# Patient Record
Sex: Male | Born: 1965
Health system: Southern US, Community
[De-identification: ages and names within clinical notes are randomized; demographics above are authoritative.]

## PROBLEM LIST (undated history)

## (undated) DIAGNOSIS — N411 Chronic prostatitis: Secondary | ICD-10-CM

## (undated) DIAGNOSIS — E785 Hyperlipidemia, unspecified: Secondary | ICD-10-CM

## (undated) DIAGNOSIS — I10 Essential (primary) hypertension: Secondary | ICD-10-CM

## (undated) DIAGNOSIS — I639 Cerebral infarction, unspecified: Secondary | ICD-10-CM

## (undated) DIAGNOSIS — E669 Obesity, unspecified: Secondary | ICD-10-CM

## (undated) DIAGNOSIS — E66811 Obesity, class 1: Secondary | ICD-10-CM

## (undated) DIAGNOSIS — K5900 Constipation, unspecified: Secondary | ICD-10-CM

## (undated) HISTORY — DX: Cerebral infarction, unspecified: I63.9

## (undated) HISTORY — DX: Obesity, class 1: E66.811

## (undated) HISTORY — DX: Essential (primary) hypertension: I10

## (undated) HISTORY — PX: HERNIA REPAIR: SHX51

## (undated) HISTORY — DX: Obesity, unspecified: E66.9

## (undated) HISTORY — DX: Hyperlipidemia, unspecified: E78.5

## (undated) HISTORY — DX: Chronic prostatitis: N41.1

## (undated) HISTORY — DX: Constipation, unspecified: K59.00

---

## 2000-04-26 ENCOUNTER — Encounter: Payer: Self-pay | Admitting: Family Medicine

## 2000-04-26 ENCOUNTER — Encounter: Admission: RE | Admit: 2000-04-26 | Discharge: 2000-04-26 | Payer: Self-pay | Admitting: Family Medicine

## 2000-05-24 ENCOUNTER — Encounter: Admission: RE | Admit: 2000-05-24 | Discharge: 2000-05-24 | Payer: Self-pay | Admitting: Orthopaedic Surgery

## 2000-05-24 ENCOUNTER — Encounter: Payer: Self-pay | Admitting: Orthopaedic Surgery

## 2001-10-11 ENCOUNTER — Encounter: Payer: Self-pay | Admitting: Emergency Medicine

## 2001-10-11 ENCOUNTER — Emergency Department (HOSPITAL_COMMUNITY): Admission: EM | Admit: 2001-10-11 | Discharge: 2001-10-12 | Payer: Self-pay | Admitting: Emergency Medicine

## 2001-10-12 ENCOUNTER — Encounter: Payer: Self-pay | Admitting: Emergency Medicine

## 2004-02-23 ENCOUNTER — Emergency Department (HOSPITAL_COMMUNITY): Admission: EM | Admit: 2004-02-23 | Discharge: 2004-02-23 | Payer: Self-pay | Admitting: *Deleted

## 2005-12-11 ENCOUNTER — Ambulatory Visit: Payer: Self-pay | Admitting: Internal Medicine

## 2009-02-05 ENCOUNTER — Encounter: Payer: Self-pay | Admitting: Internal Medicine

## 2010-12-25 ENCOUNTER — Encounter: Payer: Self-pay | Admitting: Internal Medicine

## 2010-12-25 ENCOUNTER — Ambulatory Visit (INDEPENDENT_AMBULATORY_CARE_PROVIDER_SITE_OTHER): Payer: BC Managed Care – PPO | Admitting: Internal Medicine

## 2010-12-25 VITALS — BP 126/88 | HR 90 | Temp 98.2°F | Resp 16 | Ht 70.75 in | Wt 222.0 lb

## 2010-12-25 DIAGNOSIS — Z0000 Encounter for general adult medical examination without abnormal findings: Secondary | ICD-10-CM

## 2010-12-25 LAB — CBC WITH DIFFERENTIAL/PLATELET
Basophils Absolute: 0 10*3/uL (ref 0.0–0.1)
Basophils Relative: 0.6 % (ref 0.0–3.0)
Eosinophils Absolute: 0.4 10*3/uL (ref 0.0–0.7)
Eosinophils Relative: 5.1 % — ABNORMAL HIGH (ref 0.0–5.0)
HCT: 47.3 % (ref 39.0–52.0)
Hemoglobin: 16.4 g/dL (ref 13.0–17.0)
Lymphocytes Relative: 33.3 % (ref 12.0–46.0)
Lymphs Abs: 2.5 10*3/uL (ref 0.7–4.0)
MCHC: 34.6 g/dL (ref 30.0–36.0)
MCV: 88.3 fl (ref 78.0–100.0)
Monocytes Absolute: 0.6 10*3/uL (ref 0.1–1.0)
Monocytes Relative: 7.3 % (ref 3.0–12.0)
Neutro Abs: 4.1 10*3/uL (ref 1.4–7.7)
Neutrophils Relative %: 53.7 % (ref 43.0–77.0)
Platelets: 269 10*3/uL (ref 150.0–400.0)
RBC: 5.36 Mil/uL (ref 4.22–5.81)
RDW: 13.4 % (ref 11.5–14.6)
WBC: 7.6 10*3/uL (ref 4.5–10.5)

## 2010-12-25 LAB — BASIC METABOLIC PANEL
BUN: 10 mg/dL (ref 6–23)
CO2: 25 mEq/L (ref 19–32)
Calcium: 8.9 mg/dL (ref 8.4–10.5)
Chloride: 102 mEq/L (ref 96–112)
Creatinine, Ser: 0.9 mg/dL (ref 0.4–1.5)
GFR: 93.81 mL/min (ref >60.00)
Glucose, Bld: 105 mg/dL — ABNORMAL HIGH (ref 70–99)
Potassium: 3.7 mEq/L (ref 3.5–5.1)
Sodium: 138 mEq/L (ref 135–145)

## 2010-12-25 LAB — HEPATIC FUNCTION PANEL
ALT: 47 U/L (ref 0–53)
AST: 28 U/L (ref 0–37)
Albumin: 4.8 g/dL (ref 3.5–5.2)
Alkaline Phosphatase: 69 U/L (ref 39–117)
Bilirubin, Direct: 0 mg/dL (ref 0.0–0.3)
Total Bilirubin: 0.6 mg/dL (ref 0.3–1.2)
Total Protein: 7.4 g/dL (ref 6.0–8.3)

## 2010-12-25 LAB — LDL CHOLESTEROL, DIRECT: Direct LDL: 171 mg/dL

## 2010-12-25 LAB — LIPID PANEL
Cholesterol: 216 mg/dL — ABNORMAL HIGH (ref 0–200)
HDL: 42.6 mg/dL (ref >39.00)
Total CHOL/HDL Ratio: 5
Triglycerides: 141 mg/dL (ref 0.0–149.0)
VLDL: 28.2 mg/dL (ref 0.0–40.0)

## 2010-12-25 LAB — TSH: TSH: 1.07 u[IU]/mL (ref 0.35–5.50)

## 2010-12-25 NOTE — Progress Notes (Signed)
  Subjective:    Patient ID: Dylan Fuller, male    DOB: [birth year], 46 y.o.   MRN: [id number]  HPI  45 year old patient who is seen today to establish with our practice. He has enjoyed excellent health did have the inguinal hernia. Age 16 otherwise no surgeries. No medical illnesses. No prior hospital admissions. He has been seen by Dr. Earlene Plater for prostatitis in 2011 which has not reoccurred.  Family history father died age 72 complications of valvular heart disease. He had transfusion related hepatitis C. Mother age 24 status post CABG has a history of dyslipidemia and hypertension. 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 One son age 3. Nonsmoker; [employer] graduate      Review of Systems  Constitutional: Negative for fever, chills, activity change, appetite change and fatigue.  HENT: Negative for hearing loss, ear pain, congestion, rhinorrhea, sneezing, mouth sores, trouble swallowing, neck pain, neck stiffness, dental problem, voice change, sinus pressure and tinnitus.   Eyes: Negative for photophobia, pain, redness and visual disturbance.  Respiratory: Negative for apnea, cough, choking, chest tightness, shortness of breath and wheezing.   Cardiovascular: Negative for chest pain, palpitations and leg swelling.  Gastrointestinal: Negative for nausea, vomiting, abdominal pain, diarrhea, constipation, blood in stool, abdominal distention, anal bleeding and rectal pain.  Genitourinary: Negative for dysuria, urgency, frequency, hematuria, flank pain, decreased urine volume, discharge, penile swelling, scrotal swelling, difficulty urinating, genital sores and testicular pain.  Musculoskeletal: Negative for myalgias, back pain, joint swelling, arthralgias and gait problem.  Skin: Negative for color change, rash and wound.  Neurological: Negative for dizziness, tremors, seizures, syncope, facial asymmetry, speech difficulty,  weakness, light-headedness, numbness and headaches.  Hematological: Negative for adenopathy. Does not bruise/bleed easily.  Psychiatric/Behavioral: Negative for suicidal ideas, hallucinations, behavioral problems, confusion, sleep disturbance, self-injury, dysphoric mood, decreased concentration and agitation. The patient is not nervous/anxious.        Objective:   Physical Exam  Constitutional: He is oriented to person, place, and time. He appears well-developed.  HENT:  Head: Normocephalic.  Right Ear: External ear normal.  Left Ear: External ear normal.  Eyes: Conjunctivae and EOM are normal.  Neck: Normal range of motion.  Cardiovascular: Normal rate and normal heart sounds.   Pulmonary/Chest: Breath sounds normal.  Abdominal: Bowel sounds are normal.  Musculoskeletal: Normal range of motion. He exhibits no edema and no tenderness.  Neurological: He is alert and oriented to person, place, and time.  Psychiatric: He has a normal mood and affect. His behavior is normal.          Assessment & Plan:  Mild exogenous obesity Otherwise unremarkable clinical exam  Modest weight loss exercise regimen all encouraged. Fasting laboratory studies will be reviewed

## 2010-12-25 NOTE — Patient Instructions (Signed)
It is important that you exercise regularly, at least 20 minutes 3 to 4 times per week.  If you develop chest pain or shortness of breath seek  medical attention.  You need to lose weight.  Consider a lower calorie diet and regular exercise.  Return office visit in 2 years or as needed

## 2010-12-26 NOTE — Progress Notes (Signed)
Quick Note:  Copy sent to home address ______

## 2011-12-14 ENCOUNTER — Emergency Department (HOSPITAL_COMMUNITY): Payer: BC Managed Care – PPO

## 2011-12-14 ENCOUNTER — Encounter (HOSPITAL_COMMUNITY): Payer: Self-pay | Admitting: *Deleted

## 2011-12-14 ENCOUNTER — Emergency Department (HOSPITAL_COMMUNITY)
Admission: EM | Admit: 2011-12-14 | Discharge: 2011-12-15 | Disposition: A | Discharge status: Home / Self Care | Payer: BC Managed Care – PPO | Attending: Emergency Medicine | Admitting: Emergency Medicine

## 2011-12-14 DIAGNOSIS — S93409A Sprain of unspecified ligament of unspecified ankle, initial encounter: Secondary | ICD-10-CM | POA: Insufficient documentation

## 2011-12-14 DIAGNOSIS — Y9367 Activity, basketball: Secondary | ICD-10-CM | POA: Insufficient documentation

## 2011-12-14 DIAGNOSIS — X500XXA Overexertion from strenuous movement or load, initial encounter: Secondary | ICD-10-CM | POA: Insufficient documentation

## 2011-12-14 MED ORDER — IBUPROFEN 400 MG PO TABS
800.0000 mg | ORAL_TABLET | Freq: Once | ORAL | Status: AC
  Administered 2011-12-14: 800 mg via ORAL
  Filled 2011-12-14: qty 2

## 2011-12-14 NOTE — ED Notes (Signed)
Patient with right ankle injury while playing basketball, patient states he rolled ankle, now with slight edema

## 2011-12-14 NOTE — ED Provider Notes (Signed)
History     CSN: [id number]  Arrival date & time 12/14/11  2132   First MD Initiated Contact with Patient 12/14/11 2303      Chief Complaint  Patient presents with  . Extremity Pain    right ankle    (Consider location/radiation/quality/duration/timing/severity/associated sxs/prior treatment) HPI Comments: Patient reports that just prior to arrival while playing basketball he rolled his right ankle.  He is now having swelling and pain over the lateral malleolus.  He has not taken anything for pain.  He has been applying ice to the ankle, which has helped the swelling somewhat.  He reports that he has significant pain with bearing weight.  Pain also worse with ROM of the right ankle.  He denies any numbness or tingling.    The history is provided by the patient.    Past Medical History  Diagnosis Date  . Prostatitis, chronic     Past Surgical History  Procedure Date  . Hernia repair     age 46 - ?ingunial x2    Family History  Problem Relation Age of Onset  . Arthritis Mother   . Heart disease Mother   . Hyperlipidemia Mother   . Hypertension Mother   . Heart disease Father     History  Substance Use Topics  . Smoking status: Former Smoker    Quit date: 06/15/1986  . Smokeless tobacco: Never Used  . Alcohol Use: Yes      Review of Systems  Musculoskeletal: Positive for joint swelling.  Skin: Negative for color change and wound.  Neurological: Negative for numbness.  All other systems reviewed and are negative.    Allergies  Review of patient's allergies indicates no known allergies.  Home Medications  No current outpatient prescriptions on file.  BP 150/90  Pulse 90  Temp 97.9 F (36.6 C) (Oral)  Resp 19  SpO2 96%  Physical Exam  Nursing note and vitals reviewed. Constitutional: He appears well-developed and well-nourished. No distress.  HENT:  Head: Normocephalic and atraumatic.  Neck: Normal range of motion. Neck supple.  Cardiovascular:  Normal rate, regular rhythm and normal heart sounds.   Pulses:      Dorsalis pedis pulses are 2+ on the right side, and 2+ on the left side.  Pulmonary/Chest: Effort normal and breath sounds normal. No respiratory distress. He has no wheezes. He has no rales.  Musculoskeletal:       Right ankle: He exhibits swelling. He exhibits no ecchymosis. tenderness. Lateral malleolus tenderness found. Achilles tendon normal.       Pain with ROM of the right ankle. Swelling of the lateral malleolus of right ankle.  Neurological: He is alert. No sensory deficit.  Skin: Skin is warm and dry. He is not diaphoretic.  Psychiatric: He has a normal mood and affect.    ED Course  Procedures (including critical care time)  Labs Reviewed - No data to display Dg Ankle Complete Right  12/14/2011  *RADIOLOGY REPORT*  Clinical Data: Right lateral ankle pain, swelling.  Twisting injury.  RIGHT ANKLE - COMPLETE 3+ VIEW  Comparison: None.  Findings: Lateral soft tissue swelling.  Well corticated bone fragment adjacent to the medial malleolus, likely related to old injury.  No acute fracture visualized.  Plantar calcaneal spur present.  IMPRESSION: No acute bony abnormality.  Original Report Authenticated By: Cyndie Chime, M.D.     No diagnosis found.    MDM  Negative xray.  Neurovascularly intact.  Patient given Ankle ASO  and crutches and instructed to follow up with PCP.        Pascal Lux Bradley, PA-C 12/14/11 2336

## 2011-12-14 NOTE — ED Notes (Signed)
Rolled R ankle laterally while playing basketball, planted foot and turned in different direction and rolled ankle, denies h/o ankle issues or surgeries, no meds PTA, no bruising noted, minimal swelling, ice pack applied at present, CMS intact, xrays resulted and reviewed as negative.

## 2011-12-14 NOTE — Discharge Instructions (Signed)
Ankle Sprain  An ankle sprain is an injury to the ligaments that hold the ankle joint together. Your X-ray today showed no evidence of fracture, however keep all follow-up appointments with an orthopedic specialist to have follow-up X-rays, because as we discussed fractures may not appear until 3 days after the acute injury.    TREATMENT  Rest, ice, elevation, and compression are the basic modes of treatment.    HOME CARE INSTRUCTIONS  Apply ice to the sore area for 15 to 20 minutes, 3 to 4 times per day. Do this while you are awake for the first 2 days, or as directed. This can be stopped when the swelling goes away. Put the ice in a plastic bag and place a towel between the bag of ice and your skin.  Keep your leg elevated when possible to lessen swelling.  If your caregiver recommends crutches, use them as instructed for 1 week. Then, you may walk on your ankle weight bearing as tolerated.  You may take off your ankle stabilizer at night and to take a shower or bath. Wiggle your toes in the splint several times per day if you are able.  Do not drive a vehicle on pain medication. ACTIVITY:            - Weight bearing as tolerated            - Exercises should be limited to pain free range of motion            - Can start mobilization by tracing the alphabet with your foot in the air.       SEEK MEDICAL CARE IF:  You have an increase in bruising, swelling, or pain.  Your toes feel cold.  Pain relief is not achieved with medications.  EMERGENCY:: Your toes are numb or blue or you have severe pain.  MAKE SURE YOU:  Understand these instructions.  Will watch your condition.  Will get help right away if you are not doing well or get worse   COLD THERAPY DIRECTIONS:  Ice or gel packs can be used to reduce both pain and swelling. Ice is the most helpful within the first 24 to 48 hours after an injury or flareup from overusing a muscle or joint.  Ice is effective, has very few side effects,  and is safe for most people to use.   If you expose your skin to cold temperatures for too long or without the proper protection, you can damage your skin or nerves. Watch for signs of skin damage due to cold.   HOME CARE INSTRUCTIONS  Follow these tips to use ice and cold packs safely.  Place a dry or damp towel between the ice and skin. A damp towel will cool the skin more quickly, so you may need to shorten the time that the ice is used.  For a more rapid response, add gentle compression to the ice.  Ice for no more than 10 to 20 minutes at a time. The bonier the area you are icing, the less time it will take to get the benefits of ice.  Check your skin after 5 minutes to make sure there are no signs of a poor response to cold or skin damage.  Rest 20 minutes or more in between uses.  Once your skin is numb, you can end your treatment. You can test numbness by very lightly touching your skin. The touch should be so light that you do not see  the skin dimple from the pressure of your fingertip. When using ice, most people will feel these normal sensations in this order: cold, burning, aching, and numbness.  Do not use ice on someone who cannot communicate their responses to pain, such as small children or people with dementia.   HOW TO MAKE AN ICE PACK  To make an ice pack, do one of the following:  Place crushed ice or a bag of frozen vegetables in a sealable plastic bag. Squeeze out the excess air. Place this bag inside another plastic bag. Slide the bag into a pillowcase or place a damp towel between your skin and the bag.  Mix 3 parts water with 1 part rubbing alcohol. Freeze the mixture in a sealable plastic bag. When you remove the mixture from the freezer, it will be slushy. Squeeze out the excess air. Place this bag inside another plastic bag. Slide the bag into a pillowcase or place a damp towel between your sk

## 2011-12-15 NOTE — ED Provider Notes (Signed)
Medical screening examination/treatment/procedure(s) were performed by non-physician practitioner and as supervising physician I was immediately available for consultation/collaboration.   Dylan Fuller. Oletta Lamas, MD 12/15/11 4098

## 2013-03-06 ENCOUNTER — Encounter: Payer: Self-pay | Admitting: Internal Medicine

## 2013-03-06 ENCOUNTER — Ambulatory Visit (INDEPENDENT_AMBULATORY_CARE_PROVIDER_SITE_OTHER): Payer: BC Managed Care – PPO | Admitting: Internal Medicine

## 2013-03-06 VITALS — BP 140/90 | HR 109 | Temp 98.3°F | Resp 20 | Wt 233.0 lb

## 2013-03-06 DIAGNOSIS — R1032 Left lower quadrant pain: Secondary | ICD-10-CM

## 2013-03-06 NOTE — Patient Instructions (Signed)
Call or return to clinic prn if these symptoms worsen or fail to improve as anticipated.

## 2013-03-06 NOTE — Progress Notes (Signed)
  Subjective:    Patient ID: Dylan Fuller, male    DOB: 1966-04-06, 47 y.o.   MRN: [id number]  HPI  47 year old patient who presents with a 24-hour history of left lower) abdominal pain.  The day prior he was doing some heavy yard work and is also noticed some mild low back discomfort. He denies any fever nausea change in bowel habits or other constitutional complaints. Pain is aggravated by movement  Past Medical History  Diagnosis Date  . Prostatitis, chronic     History   Social History  . Marital Status: Married    Spouse Name: N/A    Number of Children: N/A  . Years of Education: N/A   Occupational History  . Not on file.   Social History Main Topics  . Smoking status: Former Smoker    Quit date: 06/15/1986  . Smokeless tobacco: Never Used  . Alcohol Use: Yes  . Drug Use: No  . Sexual Activity: Not on file   Other Topics Concern  . Not on file   Social History Narrative  . No narrative on file    Past Surgical History  Procedure Laterality Date  . Hernia repair      age 47 - ?ingunial x2    Family History  Problem Relation Age of Onset  . Arthritis Mother   . Heart disease Mother   . Hyperlipidemia Mother   . Hypertension Mother   . Heart disease Father     No Known Allergies  No current outpatient prescriptions on file prior to visit.   No current facility-administered medications on file prior to visit.    BP 140/90  Pulse 109  Temp(Src) 98.3 F (36.8 C) (Oral)  Resp 20  Wt 233 lb (105.688 kg)  BMI 32.73 kg/m2  SpO2 97%       Review of Systems  Constitutional: Negative for fever, chills, appetite change and fatigue.  HENT: Negative for hearing loss, ear pain, congestion, sore throat, trouble swallowing, neck stiffness, dental problem, voice change and tinnitus.   Eyes: Negative for pain, discharge and visual disturbance.  Respiratory: Negative for cough, chest tightness, wheezing and stridor.   Cardiovascular: Negative for chest  pain, palpitations and leg swelling.  Gastrointestinal: Positive for abdominal pain. Negative for nausea, vomiting, diarrhea, constipation, blood in stool and abdominal distention.  Genitourinary: Negative for urgency, hematuria, flank pain, discharge, difficulty urinating and genital sores.  Musculoskeletal: Negative for myalgias, back pain, joint swelling, arthralgias and gait problem.  Skin: Negative for rash.  Neurological: Negative for dizziness, syncope, speech difficulty, weakness, numbness and headaches.  Hematological: Negative for adenopathy. Does not bruise/bleed easily.  Psychiatric/Behavioral: Negative for behavioral problems and dysphoric mood. The patient is not nervous/anxious.        Objective:   Physical Exam  Constitutional: He appears well-developed and well-nourished. No distress.  Abdominal: Soft. Bowel sounds are normal. He exhibits no distension and no mass. There is tenderness. There is no rebound and no guarding.  mild tenderness in the left lower quadrant without rebound          Assessment & Plan:   Left lower quadrant abdominal pain. Suspect this is abdominal wall origin. Doubt early/mild acute diverticulitis. Will observe at this time. The patient will notify the office if there is any fever or clinical worsening

## 2013-07-28 ENCOUNTER — Encounter: Payer: Self-pay | Admitting: Internal Medicine

## 2013-07-28 ENCOUNTER — Ambulatory Visit (INDEPENDENT_AMBULATORY_CARE_PROVIDER_SITE_OTHER): Payer: BC Managed Care – PPO | Admitting: Internal Medicine

## 2013-07-28 VITALS — BP 130/90 | HR 114 | Temp 99.3°F | Resp 20 | Ht 70.75 in | Wt 225.0 lb

## 2013-07-28 DIAGNOSIS — J111 Influenza due to unidentified influenza virus with other respiratory manifestations: Secondary | ICD-10-CM

## 2013-07-28 MED ORDER — HYDROCODONE-HOMATROPINE 5-1.5 MG/5ML PO SYRP: 5.0000 mL | ORAL_SOLUTION | Freq: Four times a day (QID) | ORAL | Status: DC | PRN

## 2013-07-28 MED ORDER — OSELTAMIVIR PHOSPHATE 75 MG PO CAPS: 75.0000 mg | ORAL_CAPSULE | Freq: Two times a day (BID) | ORAL | Status: DC

## 2013-07-28 NOTE — Patient Instructions (Addendum)
Take over-the-counter expectorants and cough medications such as  Mucinex DM.  Call if there is no improvement in 5 to 7 days or if he developed worsening cough, fever, or new symptoms, such as shortness of breath or chest pain.  Take [phone] mg of Tylenol every 6 hours as needed for pain relief or fever.  Avoid taking more than 3000 mg in a 24-hour period (  This may cause liver damage).  Influenza, Adult Influenza ("the flu") is a viral infection of the respiratory tract. It occurs more often in winter months because people spend more time in close contact with one another. Influenza can make you feel very sick. Influenza easily spreads from person to person (contagious). CAUSES  Influenza is caused by a virus that infects the respiratory tract. You can catch the virus by breathing in droplets from an infected person's cough or sneeze. You can also catch the virus by touching something that was recently contaminated with the virus and then touching your mouth, nose, or eyes. SYMPTOMS  Symptoms typically last 4 to 10 days and may include:  Fever.  Chills.  Headache, body aches, and muscle aches.  Sore throat.  Chest discomfort and cough.  Poor appetite.  Weakness or feeling tired.  Dizziness.  Nausea or vomiting. DIAGNOSIS  Diagnosis of influenza is often made based on your history and a physical exam. A nose or throat swab test can be done to confirm the diagnosis. RISKS AND COMPLICATIONS You may be at risk for a more severe case of influenza if you smoke cigarettes, have diabetes, have chronic heart disease (such as heart failure) or lung disease (such as asthma), or if you have a weakened immune system. Elderly people and pregnant women are also at risk for more serious infections. The most common complication of influenza is a lung infection (pneumonia). Sometimes, this complication can require emergency medical care and may be life-threatening. PREVENTION  An annual influenza  vaccination (flu shot) is the best way to avoid getting influenza. An annual flu shot is now routinely recommended for all adults in the [place] TREATMENT  In mild cases, influenza goes away on its own. Treatment is directed at relieving symptoms. For more severe cases, your caregiver may prescribe antiviral medicines to shorten the sickness. Antibiotic medicines are not effective, because the infection is caused by a virus, not by bacteria. HOME CARE INSTRUCTIONS  Only take over-the-counter or prescription medicines for pain, discomfort, or fever as directed by your caregiver.  Use a cool mist humidifier to make breathing easier.  Get plenty of rest until your temperature returns to normal. This usually takes 3 to 4 days.  Drink enough fluids to keep your urine clear or pale yellow.  Cover your mouth and nose when coughing or sneezing, and wash your hands well to avoid spreading the virus.  Stay home from work or school until your fever has been gone for at least 1 full day. SEEK MEDICAL CARE IF:   You have chest pain or a deep cough that worsens or produces more mucus.  You have nausea, vomiting, or diarrhea. SEEK IMMEDIATE MEDICAL CARE IF:   You have difficulty breathing, shortness of breath, or your skin or nails turn bluish.  You have severe neck pain or stiffness.  You have a severe headache, facial pain, or earache.  You have a worsening or recurring fever.  You have nausea or vomiting that cannot be controlled. MAKE SURE YOU:  Understand these instructions.  Will watch your  condition.  Will get help right away if you are not doing well or get worse. Document Released: 05/29/2000 Document Revised: 12/01/2011 Document Reviewed: 08/31/2011 [facility] Patient Information 2014 [place], [place].

## 2013-07-28 NOTE — Progress Notes (Signed)
Subjective:    Patient ID: Dylan Fuller, male    DOB: [birth year], 48 y.o.   MRN: [id number]  HPI 48 year old patient, who presents with a two-day history of cough, congestion, fever, chills, general body aches.  Cough is largely nonproductive.  No chest pain, wheezing, or shortness of breath.  No prior history of pneumonia. No pertinent exposure or travel history.  Past Medical History  Diagnosis Date  . Prostatitis, chronic     History   Social History  . Marital Status: Married    Spouse Name: N/A    Number of Children: N/A  . Years of Education: N/A   Occupational History  . Not on file.   Social History Main Topics  . Smoking status: Former Smoker    Quit date: 06/15/1986  . Smokeless tobacco: Never Used  . Alcohol Use: Yes  . Drug Use: No  . Sexual Activity: Not on file   Other Topics Concern  . Not on file   Social History Narrative  . No narrative on file    Past Surgical History  Procedure Laterality Date  . Hernia repair      age 48 - ?ingunial x2    Family History  Problem Relation Age of Onset  . Arthritis Mother   . Heart disease Mother   . Hyperlipidemia Mother   . Hypertension Mother   . Heart disease Father     No Known Allergies  Current Outpatient Prescriptions on File Prior to Visit  Medication Sig Dispense Refill  . naproxen sodium (ALEVE) 220 MG tablet Take 220 mg by mouth as needed.       No current facility-administered medications on file prior to visit.    BP 130/90  Pulse 114  Temp(Src) 99.3 F (37.4 C) (Oral)  Resp 20  Ht 5' 10.75" (1.797 m)  Wt 225 lb (102.059 kg)  BMI 31.60 kg/m2  SpO2 98%        Review of Systems  Constitutional: Positive for fever, chills, activity change, appetite change and fatigue.  HENT: Negative for congestion, dental problem, ear pain, hearing loss, sore throat, tinnitus, trouble swallowing and voice change.   Eyes: Negative for pain, discharge and visual disturbance.    Respiratory: Positive for cough. Negative for chest tightness, wheezing and stridor.   Cardiovascular: Negative for chest pain, palpitations and leg swelling.  Gastrointestinal: Positive for nausea. Negative for vomiting, abdominal pain, diarrhea, constipation, blood in stool and abdominal distention.  Genitourinary: Negative for urgency, hematuria, flank pain, discharge, difficulty urinating and genital sores.  Musculoskeletal: Negative for arthralgias, back pain, gait problem, joint swelling, myalgias and neck stiffness.  Skin: Negative for rash.  Neurological: Negative for dizziness, syncope, speech difficulty, weakness, numbness and headaches.  Hematological: Negative for adenopathy. Does not bruise/bleed easily.  Psychiatric/Behavioral: Negative for behavioral problems and dysphoric mood. The patient is not nervous/anxious.        Objective:   Physical Exam  Constitutional: He is oriented to person, place, and time. He appears well-developed.  HENT:  Head: Normocephalic.  Right Ear: External ear normal.  Left Ear: External ear normal.  Eyes: Conjunctivae and EOM are normal.  Neck: Normal range of motion.  Cardiovascular: Normal rate and normal heart sounds.   Pulmonary/Chest: Effort normal and breath sounds normal. No respiratory distress. He has no wheezes. He has no rales. He exhibits no tenderness.  Careful auscultation of the chest revealed all lung fields to be clear  Abdominal: Bowel sounds are normal.  Musculoskeletal: Normal range of motion. He exhibits no edema and no tenderness.  Neurological: He is alert and oriented to person, place, and time.  Psychiatric: He has a normal mood and affect. His behavior is normal.          Assessment & Plan:  Flu syndrome.  We'll treat with Tamiflu for 5 days, as well as symptom control We'll call if unimproved

## 2013-07-28 NOTE — Progress Notes (Signed)
Pre-visit discussion using our clinic review tool. No additional management support is needed unless otherwise documented below in the visit note.  

## 2015-07-25 ENCOUNTER — Other Ambulatory Visit: Payer: Self-pay | Admitting: Physician Assistant

## 2015-07-25 ENCOUNTER — Ambulatory Visit
Admission: RE | Admit: 2015-07-25 | Discharge: 2015-07-25 | Disposition: A | Discharge status: Home / Self Care | Payer: BLUE CROSS/BLUE SHIELD | Source: Ambulatory Visit | Attending: Physician Assistant | Admitting: Physician Assistant

## 2015-07-25 DIAGNOSIS — R600 Localized edema: Secondary | ICD-10-CM

## 2015-10-23 ENCOUNTER — Ambulatory Visit (INDEPENDENT_AMBULATORY_CARE_PROVIDER_SITE_OTHER): Payer: BLUE CROSS/BLUE SHIELD | Admitting: Internal Medicine

## 2015-10-23 ENCOUNTER — Encounter: Payer: Self-pay | Admitting: Internal Medicine

## 2015-10-23 VITALS — BP 140/90 | HR 109 | Temp 98.0°F | Resp 20 | Ht 70.75 in | Wt 240.0 lb

## 2015-10-23 DIAGNOSIS — R300 Dysuria: Secondary | ICD-10-CM | POA: Diagnosis not present

## 2015-10-23 DIAGNOSIS — R1030 Lower abdominal pain, unspecified: Secondary | ICD-10-CM

## 2015-10-23 LAB — POC URINALSYSI DIPSTICK (AUTOMATED)
Glucose, UA: NEGATIVE
LEUKOCYTES UA: NEGATIVE
Nitrite, UA: NEGATIVE
PH UA: 7
RBC UA: NEGATIVE
Spec Grav, UA: 1.025
Urobilinogen, UA: 1

## 2015-10-23 NOTE — Patient Instructions (Signed)
Call or return to clinic prn if these symptoms worsen or fail to improve as anticipated.

## 2015-10-23 NOTE — Progress Notes (Signed)
Pre visit review using our clinic review tool, if applicable. No additional management support is needed unless otherwise documented below in the visit note. 

## 2015-10-23 NOTE — Progress Notes (Signed)
   Subjective:    Patient ID: Dylan Fuller, male    DOB: 03-Jan-1966, 50 y.o.   MRN: [id number]  HPI    50 year old patient who presents with a one-day history of intermittent low back pain, groin pain, and mild dysuria.  He states that he had similar symptoms about 5 years ago, but much more severe when he was treated for acute prostatitis  Past Medical History  Diagnosis Date  . Prostatitis, chronic      Social History   Social History  . Marital Status: Married    Spouse Name: N/A  . Number of Children: N/A  . Years of Education: N/A   Occupational History  . Not on file.   Social History Main Topics  . Smoking status: Former Smoker    Quit date: 06/15/1986  . Smokeless tobacco: Never Used  . Alcohol Use: Yes  . Drug Use: No  . Sexual Activity: Not on file   Other Topics Concern  . Not on file   Social History Narrative    Past Surgical History  Procedure Laterality Date  . Hernia repair      age 21 - ?ingunial x2    Family History  Problem Relation Age of Onset  . Arthritis Mother   . Heart disease Mother   . Hyperlipidemia Mother   . Hypertension Mother   . Heart disease Father     No Known Allergies  Current Outpatient Prescriptions on File Prior to Visit  Medication Sig Dispense Refill  . naproxen sodium (ALEVE) 220 MG tablet Take 220 mg by mouth as needed.     No current facility-administered medications on file prior to visit.    BP 140/90 mmHg  Pulse 109  Temp(Src) 98 F (36.7 C) (Oral)  Resp 20  Ht 5' 10.75" (1.797 m)  Wt 240 lb (108.863 kg)  BMI 33.71 kg/m2  SpO2 98%     Review of Systems  Constitutional: Negative for fever, chills, appetite change and fatigue.  HENT: Negative for congestion, dental problem, ear pain, hearing loss, sore throat, tinnitus, trouble swallowing and voice change.   Eyes: Negative for pain, discharge and visual disturbance.  Respiratory: Negative for cough, chest tightness, wheezing and stridor.     Cardiovascular: Negative for chest pain, palpitations and leg swelling.  Gastrointestinal: Negative for nausea, vomiting, abdominal pain, diarrhea, constipation, blood in stool and abdominal distention.  Genitourinary: Positive for dysuria. Negative for urgency, hematuria, flank pain, discharge, difficulty urinating and genital sores.  Musculoskeletal: Positive for back pain. Negative for myalgias, joint swelling, arthralgias, gait problem and neck stiffness.  Skin: Negative for rash.  Neurological: Negative for dizziness, syncope, speech difficulty, weakness, numbness and headaches.  Hematological: Negative for adenopathy. Does not bruise/bleed easily.  Psychiatric/Behavioral: Negative for behavioral problems and dysphoric mood. The patient is not nervous/anxious.        Objective:   Physical Exam  Constitutional: He appears well-developed and well-nourished. No distress.  Abdominal: Soft. Bowel sounds are normal.  Genitourinary: Prostate normal.          Assessment & Plan:   , mild dysuria with groin and back pain , history of prostatitis  normal.  Clinical exam  normal.  UA   will continue ibuprofen and observe at this time .  He'll report any new or worsening symptoms

## 2016-09-18 ENCOUNTER — Encounter: Payer: Self-pay | Admitting: Family Medicine

## 2016-09-18 ENCOUNTER — Ambulatory Visit (INDEPENDENT_AMBULATORY_CARE_PROVIDER_SITE_OTHER): Payer: BLUE CROSS/BLUE SHIELD | Admitting: Family Medicine

## 2016-09-18 VITALS — BP 130/90 | HR 64 | Temp 98.0°F | Wt 229.9 lb

## 2016-09-18 DIAGNOSIS — Z1211 Encounter for screening for malignant neoplasm of colon: Secondary | ICD-10-CM | POA: Diagnosis not present

## 2016-09-18 DIAGNOSIS — K5900 Constipation, unspecified: Secondary | ICD-10-CM

## 2016-09-18 LAB — CBC WITH DIFFERENTIAL/PLATELET
Basophils Absolute: 0 cells/uL (ref 0–200)
Basophils Relative: 0 %
EOS PCT: 3 %
Eosinophils Absolute: 273 cells/uL (ref 15–500)
HCT: 44.9 % (ref 38.5–50.0)
HEMOGLOBIN: 15.3 g/dL (ref 13.2–17.1)
LYMPHS ABS: 3003 {cells}/uL (ref 850–3900)
Lymphocytes Relative: 33 %
MCH: 29.5 pg (ref 27.0–33.0)
MCHC: 34.1 g/dL (ref 32.0–36.0)
MCV: 86.5 fL (ref 80.0–100.0)
MPV: 8.8 fL (ref 7.5–12.5)
Monocytes Absolute: 546 cells/uL (ref 200–950)
Monocytes Relative: 6 %
Neutro Abs: 5278 cells/uL (ref 1500–7800)
Neutrophils Relative %: 58 %
Platelets: 268 10*3/uL (ref 140–400)
RBC: 5.19 MIL/uL (ref 4.20–5.80)
RDW: 13.5 % (ref 11.0–15.0)
WBC: 9.1 10*3/uL (ref 3.8–10.8)

## 2016-09-18 LAB — TSH: TSH: 1.03 mIU/L (ref 0.40–4.50)

## 2016-09-18 NOTE — Progress Notes (Signed)
Subjective:     Patient ID: Dylan Fuller, male   DOB: Nov 08, 1965, 51 y.o.   MRN: [id number]  HPI Patient is seen with constipation as chief complaint. He states last May had similar episode of constipation. Most recent episode started around March 21. He went almost 10 days without much of a significant bowel movement. He had some bloating and right lower quadrant discomfort. Took some milk of magnesia, MiraLAX, and Fleet enema and eventually had good bowel movement couple days ago. He feels much better today. He has not noted any bloody stools. Denies any recent appetite or weight changes. Currently denies abdominal pain.  Denies any recent travels. Takes no regular medications. Never had screening colonoscopy. Been trying to drink more water. He realizes he probably doesn't get enough fiber  Past Medical History:  Diagnosis Date  . Prostatitis, chronic    Past Surgical History:  Procedure Laterality Date  . HERNIA REPAIR     age 56 - ?ingunial x2    reports that he quit smoking about 30 years ago. He has never used smokeless tobacco. He reports that he drinks alcohol. He reports that he does not use drugs. family history includes Arthritis in his mother; Heart disease in his father and mother; Hyperlipidemia in his mother; Hypertension in his mother. No Known Allergies   Review of Systems  Constitutional: Negative for appetite change, fever and unexpected weight change.  Respiratory: Negative for shortness of breath.   Cardiovascular: Negative for chest pain.  Gastrointestinal: Positive for constipation. Negative for abdominal distention, abdominal pain, blood in stool, diarrhea, nausea and vomiting.  Genitourinary: Negative for dysuria.       Objective:   Physical Exam  Constitutional: He appears well-developed and well-nourished.  Cardiovascular: Normal rate and regular rhythm.   Pulmonary/Chest: Effort normal and breath sounds normal. No respiratory distress. He has no  wheezes. He has no rales.  Abdominal: Soft. Bowel sounds are normal. He exhibits no distension and no mass. There is no tenderness. There is no rebound and no guarding.  Genitourinary: Rectum normal.  Genitourinary Comments: No impaction. No rectal mass palpated.       Assessment:     Constipation which is relatively new symptom.  Improved after recent over-the-counter medications with MiraLAX and enema    Plan:     -Increase water consumption -Recommend 25-30 g of fiber per day -Regular exercise -Check labs with CBC and TSH -Set up screening colonoscopy -continue with Miralax as needed.  Eulas Post MD [facility] at [facility]

## 2016-09-18 NOTE — Patient Instructions (Signed)

## 2016-09-18 NOTE — Progress Notes (Signed)
Pre visit review using our clinic review tool, if applicable. No additional management support is needed unless otherwise documented below in the visit note. 

## 2016-09-24 ENCOUNTER — Encounter: Payer: Self-pay | Admitting: Gastroenterology

## 2016-11-16 ENCOUNTER — Ambulatory Visit (AMBULATORY_SURGERY_CENTER): Payer: Self-pay | Admitting: *Deleted

## 2016-11-16 VITALS — Ht 72.0 in | Wt 231.6 lb

## 2016-11-16 DIAGNOSIS — Z1211 Encounter for screening for malignant neoplasm of colon: Secondary | ICD-10-CM

## 2016-11-16 MED ORDER — NA SULFATE-K SULFATE-MG SULF 17.5-3.13-1.6 GM/177ML PO SOLN: 1.0000 | Freq: Once | ORAL | 0 refills | Status: AC

## 2016-11-16 NOTE — Progress Notes (Signed)
No egg or soy allergy known to patient  No issues with past sedation with any surgeries  or procedures, no intubation problems  No diet pills per patient No home 02 use per patient  No blood thinners per patient  Pt denies issues with constipation  No A fib or A flutter   

## 2016-11-27 ENCOUNTER — Encounter: Payer: Self-pay | Admitting: Gastroenterology

## 2016-11-30 ENCOUNTER — Encounter: Payer: BLUE CROSS/BLUE SHIELD | Admitting: Gastroenterology

## 2016-12-11 ENCOUNTER — Ambulatory Visit (AMBULATORY_SURGERY_CENTER): Payer: BLUE CROSS/BLUE SHIELD | Admitting: Gastroenterology

## 2016-12-11 ENCOUNTER — Encounter: Payer: Self-pay | Admitting: Gastroenterology

## 2016-12-11 VITALS — BP 118/88 | HR 73 | Temp 99.1°F | Resp 15 | Ht 72.0 in | Wt 231.0 lb

## 2016-12-11 DIAGNOSIS — D126 Benign neoplasm of colon, unspecified: Secondary | ICD-10-CM

## 2016-12-11 DIAGNOSIS — Z1212 Encounter for screening for malignant neoplasm of rectum: Secondary | ICD-10-CM | POA: Diagnosis not present

## 2016-12-11 DIAGNOSIS — Z1211 Encounter for screening for malignant neoplasm of colon: Secondary | ICD-10-CM

## 2016-12-11 DIAGNOSIS — K635 Polyp of colon: Secondary | ICD-10-CM

## 2016-12-11 DIAGNOSIS — D127 Benign neoplasm of rectosigmoid junction: Secondary | ICD-10-CM

## 2016-12-11 DIAGNOSIS — D123 Benign neoplasm of transverse colon: Secondary | ICD-10-CM

## 2016-12-11 DIAGNOSIS — K6389 Other specified diseases of intestine: Secondary | ICD-10-CM | POA: Diagnosis not present

## 2016-12-11 DIAGNOSIS — D124 Benign neoplasm of descending colon: Secondary | ICD-10-CM | POA: Diagnosis not present

## 2016-12-11 DIAGNOSIS — D125 Benign neoplasm of sigmoid colon: Secondary | ICD-10-CM

## 2016-12-11 MED ORDER — SODIUM CHLORIDE 0.9 % IV SOLN: 500.0000 mL | INTRAVENOUS | Status: DC

## 2016-12-11 NOTE — Progress Notes (Signed)
Called to room to assist during endoscopic procedure.  Patient ID and intended procedure confirmed with present staff. Received instructions for my participation in the procedure from the performing physician.  

## 2016-12-11 NOTE — Progress Notes (Signed)
Pt's states no medical or surgical changes since previsit or office visit. 

## 2016-12-11 NOTE — Progress Notes (Signed)
Alert and oriented x3, pleased with MAC, report to RN  

## 2016-12-11 NOTE — Patient Instructions (Signed)
YOU HAD AN ENDOSCOPIC PROCEDURE TODAY AT [facility]:   Refer to the procedure report that was given to you for any specific questions about what was found during the examination.  If the procedure report does not answer your questions, please call your gastroenterologist to clarify.  If you requested that your care partner not be given the details of your procedure findings, then the procedure report has been included in a sealed envelope for you to review at your convenience later.  YOU SHOULD EXPECT: Some feelings of bloating in the abdomen. Passage of more gas than usual.  Walking can help get rid of the air that was put into your GI tract during the procedure and reduce the bloating. If you had a lower endoscopy (such as a colonoscopy or flexible sigmoidoscopy) you may notice spotting of blood in your stool or on the toilet paper. If you underwent a bowel prep for your procedure, you may not have a normal bowel movement for a few days.  Please Note:  You might notice some irritation and congestion in your nose or some drainage.  This is from the oxygen used during your procedure.  There is no need for concern and it should clear up in a day or so.  SYMPTOMS TO REPORT IMMEDIATELY:   Following lower endoscopy (colonoscopy or flexible sigmoidoscopy):  Excessive amounts of blood in the stool  Significant tenderness or worsening of abdominal pains  Swelling of the abdomen that is new, acute  Fever of 100F or higher   For urgent or emergent issues, a gastroenterologist can be reached at any hour by calling [phone].   DIET:  We do recommend a small meal at first, but then you may proceed to your regular diet.  Drink plenty of fluids but you should avoid alcoholic beverages for 24 hours.  ACTIVITY:  You should plan to take it easy for the rest of today and you should NOT DRIVE or use heavy machinery until tomorrow (because of the sedation medicines used during the test).     FOLLOW UP: Our staff will call the number listed on your records the next business day following your procedure to check on you and address any questions or concerns that you may have regarding the information given to you following your procedure. If we do not reach you, we will leave a message.  However, if you are feeling well and you are not experiencing any problems, there is no need to return our call.  We will assume that you have returned to your regular daily activities without incident.  If any biopsies were taken you will be contacted by phone or by letter within the next 1-3 weeks.  Please call us at [phone] if you have not heard about the biopsies in 3 weeks.   Await for biopsy results to determine next repeat Colonoscopy screening Polyps (handout given) 'Diverticulosis (handout given) No Ibuprofen, naproxen, or other non-steriodal anti-inflammatory drugs for 2 weeks after polyp removal  SIGNATURES/CONFIDENTIALITY: You and/or your care partner have signed paperwork which will be entered into your electronic medical record.  These signatures attest to the fact that that the information above on your After Visit Summary has been reviewed and is understood.  Full responsibility of the confidentiality of this discharge information lies with you and/or your care-partner.

## 2016-12-11 NOTE — Op Note (Addendum)
[place] Patient Name: Dylan Fuller Procedure Date: 12/11/2016 11:19 AM MRN: [id number] Endoscopist: Remo Lipps P. Kioni Stahl MD, MD Age: 51 Referring MD:  Date of Birth: [birth year] Gender: Male Account #: [id number] Procedure:                Colonoscopy Indications:              Screening for colorectal malignant neoplasm, This                            is the patient's first colonoscopy Medicines:                Monitored Anesthesia Care Procedure:                Pre-Anesthesia Assessment:                           - Prior to the procedure, a History and Physical                            was performed, and patient medications and                            allergies were reviewed. The patient's tolerance of                            previous anesthesia was also reviewed. The risks                            and benefits of the procedure and the sedation                            options and risks were discussed with the patient.                            All questions were answered, and informed consent                            was obtained. Prior Anticoagulants: The patient has                            taken no previous anticoagulant or antiplatelet                            agents. ASA Grade Assessment: II - A patient with                            mild systemic disease. After reviewing the risks                            and benefits, the patient was deemed in                            satisfactory condition to undergo the procedure.  After obtaining informed consent, the colonoscope                            was passed under direct vision. Throughout the                            procedure, the patient's blood pressure, pulse, and                            oxygen saturations were monitored continuously. The                            Colonoscope was introduced through the anus and                            advanced to the the  cecum, identified by                            appendiceal orifice and ileocecal valve. The                            colonoscopy was performed without difficulty. The                            patient tolerated the procedure well. The quality                            of the bowel preparation was good. The ileocecal                            valve, appendiceal orifice, and rectum were                            photographed. Scope In: 11:27:27 AM Scope Out: 11:47:15 AM Scope Withdrawal Time: 0 hours 16 minutes 19 seconds  Total Procedure Duration: 0 hours 19 minutes 48 seconds  Findings:                 The perianal and digital rectal examinations were                            normal.                           Multiple medium-mouthed diverticula were found in                            the transverse colon and left colon.                           Two sessile polyps were found in the transverse                            colon. The polyps were 5 mm in size. These polyps  were removed with a cold snare. Resection and                            retrieval were complete.                           A 5 mm polyp was found in the descending colon. The                            polyp was sessile. The polyp was removed with a                            cold snare. Resection and retrieval were complete.                           A 4 mm polyp was found in the sigmoid colon arising                            out of a diverticulum, it appeared to most likely                            be an inflammatory polyp. The polyp was sessile.                            The polyp was removed with a cold snare.                           A 5 mm polyp was found in the recto-sigmoid colon.                            The polyp was sessile. The polyp was removed with a                            cold snare. Resection and retrieval were complete.                           The exam was  otherwise without abnormality on                            direct and retroflexion views. Complications:            No immediate complications. Estimated blood loss:                            Minimal. Estimated Blood Loss:     Estimated blood loss was minimal. Impression:               - Diverticulosis in the transverse colon and in the                            left colon.                           - Two 5  mm polyps in the transverse colon, removed                            with a cold snare. Resected and retrieved.                           - One 5 mm polyp in the descending colon, removed                            with a cold snare. Resected and retrieved.                           - One 4 mm polyp in the sigmoid colon, removed with                            a cold snare. Resected and retrieved.                           - One 5 mm polyp at the recto-sigmoid colon,                            removed with a cold snare. Resected and retrieved.                           - The examination was otherwise normal on direct                            and retroflexion views. Recommendation:           - Patient has a contact number available for                            emergencies. The signs and symptoms of potential                            delayed complications were discussed with the                            patient. Return to normal activities tomorrow.                            Written discharge instructions were provided to the                            patient.                           - Resume previous diet.                           - Continue present medications.                           - Await pathology results.                           -  Repeat colonoscopy is recommended for                            surveillance. The colonoscopy date will be                            determined after pathology results from today's                            exam become available  for review.                           - No ibuprofen, naproxen, or other non-steroidal                            anti-inflammatory drugs for 2 weeks after polyp                            removal. Remo Lipps P. Ryanna Teschner MD, MD 12/11/2016 11:53:08 AM This report has been signed electronically.

## 2016-12-14 ENCOUNTER — Telehealth: Payer: Self-pay

## 2016-12-14 ENCOUNTER — Telehealth: Payer: Self-pay | Admitting: Gastroenterology

## 2016-12-14 NOTE — Telephone Encounter (Signed)
Left message on answering machine. 

## 2016-12-14 NOTE — Telephone Encounter (Signed)
  Follow up Call-  Call back number 12/11/2016  Post procedure Call Back phone  # [phone]  Permission to leave phone message Yes  Some recent data might be hidden     Left message

## 2016-12-18 ENCOUNTER — Encounter: Payer: Self-pay | Admitting: Gastroenterology

## 2017-01-13 ENCOUNTER — Encounter: Payer: Self-pay | Admitting: Internal Medicine

## 2017-01-13 ENCOUNTER — Telehealth: Payer: Self-pay | Admitting: Internal Medicine

## 2017-01-13 ENCOUNTER — Ambulatory Visit (INDEPENDENT_AMBULATORY_CARE_PROVIDER_SITE_OTHER): Payer: BLUE CROSS/BLUE SHIELD | Admitting: Internal Medicine

## 2017-01-13 VITALS — BP 138/82 | HR 70 | Temp 97.9°F | Ht 72.0 in | Wt 233.6 lb

## 2017-01-13 DIAGNOSIS — Z86010 Personal history of colonic polyps: Secondary | ICD-10-CM

## 2017-01-13 DIAGNOSIS — L309 Dermatitis, unspecified: Secondary | ICD-10-CM | POA: Diagnosis not present

## 2017-01-13 NOTE — Progress Notes (Signed)
Subjective:    Patient ID: Dylan Fuller, male    DOB: 21-Sep-1965, 51 y.o.   MRN: [id number]  HPI 51 year old patient who is seen today with some dermatology concerns.  He has a small papular skin lesion beneath the right eye that has been present for some time. He also more recently has been placed on a high potency steroid for a chronic dermatitis involving his right lateral lower leg.  He also has some concerns about a toenail onychomycosis.  He did have a colonoscopy last month that did reveal adenomatous polyps  Past Medical History:  Diagnosis Date  . Constipation   . Prostatitis, chronic      Social History   Social History  . Marital status: Married    Spouse name: N/A  . Number of children: N/A  . Years of education: N/A   Occupational History  . Not on file.   Social History Main Topics  . Smoking status: Never Smoker  . Smokeless tobacco: Never Used     Comment: pt states he has smoked a few cigarettes in his lifetime only   . Alcohol use Yes     Comment: social  . Drug use: No  . Sexual activity: Not on file   Other Topics Concern  . Not on file   Social History Narrative  . No narrative on file    Past Surgical History:  Procedure Laterality Date  . HERNIA REPAIR     age 17 - ?ingunial x2    Family History  Problem Relation Age of Onset  . Arthritis Mother   . Heart disease Mother   . Hyperlipidemia Mother   . Hypertension Mother   . Heart disease Father   . Colon cancer Neg Hx   . Colon polyps Neg Hx   . Esophageal cancer Neg Hx   . Rectal cancer Neg Hx   . Stomach cancer Neg Hx     No Known Allergies  Current Outpatient Prescriptions on File Prior to Visit  Medication Sig Dispense Refill  . ibuprofen (ADVIL,MOTRIN) 200 MG tablet Take 600-800 mg by mouth as needed.    . naproxen sodium (ALEVE) 220 MG tablet Take 220 mg by mouth as needed.     Current Facility-Administered Medications on File Prior to Visit  Medication Dose Route  Frequency Provider Last Rate Last Dose  . 0.9 %  sodium chloride infusion  500 mL Intravenous Continuous Armbruster, Renelda Loma, MD        BP 138/82 (BP Location: Left Arm, Patient Position: Sitting, Cuff Size: Normal)   Pulse 70   Temp 97.9 F (36.6 C) (Oral)   Ht 6' (1.829 m)   Wt 233 lb 9.6 oz (106 kg)   SpO2 97%   BMI 31.68 kg/m      Review of Systems  Skin: Positive for rash.       Objective:   Physical Exam  Constitutional: He appears well-developed and well-nourished. No distress.  Blood pressure 130/80  Skin:  A 3 mm papular lesion beneath the right eye-slightly pedunculated Some skin tags noted over the left arm Patient had a dry thickened, slightly scaly dermatitis involving his right lower leg that has been present for probably 4 years Patient had some thickened, discolored great toenails          Assessment & Plan:   Dermatologic issues including Small papular lesion right facial area Chronic dermatitis, right lateral lower leg Possible toenail onychomycosis  We'll set up  for dermatologic evaluation  Health maintenance.  Patient did have colonoscopy performed last month that revealed colonic polyps Schedule CPX  Aamirah Salmi Pilar Plate

## 2017-01-13 NOTE — Telephone Encounter (Signed)
Pharmacy updated.

## 2017-01-13 NOTE — Patient Instructions (Signed)
Dermatology consultation as discussed  Schedule annual exam in 6 months

## 2017-01-13 NOTE — Telephone Encounter (Signed)
Patient would like to update Pharmacy to [facility] at [facility] Dr. Lady Gary.  I tried to do this but I don't think it updated.

## 2017-03-04 ENCOUNTER — Encounter: Payer: Self-pay | Admitting: Internal Medicine

## 2017-03-16 ENCOUNTER — Telehealth: Payer: Self-pay | Admitting: *Deleted

## 2017-03-16 DIAGNOSIS — L309 Dermatitis, unspecified: Secondary | ICD-10-CM

## 2017-03-16 NOTE — Telephone Encounter (Signed)
New referral order placed in epic.

## 2017-03-16 NOTE — Telephone Encounter (Signed)
Patient called stating the referral we sent to the skin surgery center is booked out to far and patient is requesting a new referral to [facility].

## 2017-10-24 ENCOUNTER — Encounter (HOSPITAL_COMMUNITY): Payer: Self-pay | Admitting: Emergency Medicine

## 2017-10-24 ENCOUNTER — Ambulatory Visit (HOSPITAL_COMMUNITY)
Admission: EM | Admit: 2017-10-24 | Discharge: 2017-10-24 | Disposition: A | Discharge status: Home / Self Care | Payer: BLUE CROSS/BLUE SHIELD | Attending: Internal Medicine | Admitting: Internal Medicine

## 2017-10-24 DIAGNOSIS — Z8261 Family history of arthritis: Secondary | ICD-10-CM | POA: Insufficient documentation

## 2017-10-24 DIAGNOSIS — J029 Acute pharyngitis, unspecified: Secondary | ICD-10-CM | POA: Insufficient documentation

## 2017-10-24 DIAGNOSIS — Z8249 Family history of ischemic heart disease and other diseases of the circulatory system: Secondary | ICD-10-CM | POA: Diagnosis not present

## 2017-10-24 LAB — POCT RAPID STREP A: Streptococcus, Group A Screen (Direct): NEGATIVE

## 2017-10-24 MED ORDER — AMOXICILLIN 500 MG PO CAPS: 1000.0000 mg | ORAL_CAPSULE | Freq: Two times a day (BID) | ORAL | 0 refills | Status: DC

## 2017-10-24 MED ORDER — LIDOCAINE VISCOUS 2 % MT SOLN: 5.0000 mL | OROMUCOSAL | 0 refills | Status: DC | PRN

## 2017-10-24 NOTE — ED Triage Notes (Signed)
Pt here for sore throat x 2 days 

## 2017-10-24 NOTE — ED Provider Notes (Signed)
10/24/2017 6:54 PM   DOB: 01-16-1966 / MRN: [id number]  SUBJECTIVE:  Dylan Fuller is a 52 y.o. male presenting for sore throat.  Present for 2 days now.  Patient denies fever, cough, nasal congestion, sneezing, GERD.  He is worried he has strep throat.  He has No Known Allergies.   He  has a past medical history of Constipation and Prostatitis, chronic.    He  reports that he has never smoked. He has never used smokeless tobacco. He reports that he drinks alcohol. He reports that he does not use drugs. He  has no sexual activity history on file. The patient  has a past surgical history that includes Hernia repair.  His family history includes Arthritis in his mother; Heart disease in his father and mother; Hyperlipidemia in his mother; Hypertension in his mother.  ROS per H PI.  OBJECTIVE:  BP (!) 146/98 (BP Location: Right Arm)   Pulse 96   Temp 99.3 F (37.4 C) (Oral)   Resp 18   SpO2 95%   Physical Exam  Constitutional: He is oriented to person, place, and time. He appears well-developed. He does not appear ill.  HENT:  Mouth/Throat: Mucous membranes are not pale, not dry and not cyanotic. Tonsils are 1+ on the right. Tonsils are 1+ on the left. No tonsillar exudate (Throat erythematous).  Eyes: Pupils are equal, round, and reactive to light. Conjunctivae and EOM are normal.  Cardiovascular: Normal rate, regular rhythm, S1 normal, S2 normal, normal heart sounds, intact distal pulses and normal pulses. Exam reveals no gallop and no friction rub.  No murmur heard. Pulmonary/Chest: Effort normal. No stridor. No respiratory distress. He has no wheezes. He has no rales.  Abdominal: He exhibits no distension.  Musculoskeletal: Normal range of motion. He exhibits no edema.  Neurological: He is alert and oriented to person, place, and time. No cranial nerve deficit. Coordination normal.  Skin: Skin is warm and dry. He is not diaphoretic.  Psychiatric: He has a normal mood and affect.   Nursing note and vitals reviewed.   Results for orders placed or performed during the hospital encounter of 10/24/17 (from the past 72 hour(s))  POCT rapid strep A [facility])     Status: None   Collection Time: 10/24/17  6:44 PM  Result Value Ref Range   Streptococcus, Group A Screen (Direct) NEGATIVE NEGATIVE    No results found.  ASSESSMENT AND PLAN:  Orders Placed This Encounter  Procedures  . Culture, group A strep    Standing Status:   Standing    Number of Occurrences:   1  . POCT rapid strep A [facility])    Standing Status:   Standing    Number of Occurrences:   1     Pharyngitis, unspecified etiology: Throat erythematous.  Exam otherwise normally.       The patient is advised to call or return to clinic if he does not see an improvement in symptoms, or to seek the care of the closest emergency department if he worsens with the above plan.   Philis Fendt, MHS, PA-C 10/24/2017 6:54 PM    Tereasa Coop, PA-C 10/24/17 [id number]

## 2017-10-24 NOTE — Discharge Instructions (Signed)
Rapid test is negative. Monitor your symtpoms and okay to start antibiotic if worsening or fever of greater than 100.4.

## 2017-10-27 LAB — CULTURE, GROUP A STREP (THRC)

## 2018-03-09 ENCOUNTER — Ambulatory Visit: Payer: Self-pay | Admitting: *Deleted

## 2018-03-09 NOTE — Addendum Note (Signed)
Addended by: Dimple Nanas on: 03/09/2018 01:16 PM   Modules accepted: Level of Service, SmartSet

## 2018-03-09 NOTE — Telephone Encounter (Signed)
This encounter was created in error - please disregard.

## 2018-03-09 NOTE — Telephone Encounter (Signed)
Pt calling with complaints of having blurry vision that occurred last night and is not present at this time. Pt states that while at work last night his vision was blurry and he had trouble focusing. Blurriness was worse in left eye than right eye. Pt also notes that his BP last night was 159/109 and P-89. Pt denies having blurry vision or other symptoms at this time. Pt has not checked his BP today and is not currently on any BP medications. Pt notes that he has not been for an office visit in over a year and the last time his BP was checked it was 130s/80s. Pt requesting to come in tomorrow for appt. Pt scheduled for appt and advised that if symptoms became worse before scheduled appt to return call to the office or to seek care in the ED. Pt verbalized understanding.   Reason for Disposition . [1] Brief (now gone) blurred vision AND [2] unexplained . [6] Systolic BP  >= 578 OR Diastolic >= 80 AND [4] not taking BP medications  Answer Assessment - Initial Assessment Questions 1. DESCRIPTION: "What is the vision loss like? Describe it for me." (e.g., complete vision loss, blurred vision, double vision, floaters, etc.)     Blurry vision last night but not present at this time 2. LOCATION: "One or both eyes?" If one, ask: "Which eye?"     Blurry vision in both eyes but was worse in the left eye 3. SEVERITY: "Can you see anything?" If so, ask: "What can you see?" (e.g., fine print)     Pt is able to see normally at this time 4. ONSET: "When did this begin?" "Did it start suddenly or has this been gradual?"     Occurred last night and gradually got better during the night not present at this time 5. PATTERN: "Does this come and go, or has it been constant since it started?"     Gone currently only occurred last night 6. PAIN: "Is there any pain in your eye(s)?"  (Scale 1-10; or mild, moderate, severe)     Denies any pain  7. CONTACTS-GLASSES: "Do you wear contacts or glasses?"     Not assessed 8.  CAUSE: "What do you think is causing this visual problem?"     Unsure, but noted that BP was increased last night while at work 9. OTHER SYMPTOMS: "Do you have any other symptoms?" (e.g., confusion, headache, arm or leg weakness, speech problems)     No 10. PREGNANCY: "Is there any chance you are pregnant?" "When was your last menstrual period?"       n/a  Answer Assessment - Initial Assessment Questions 1. BLOOD PRESSURE: "What is the blood pressure?" "Did you take at least two measurements 5 minutes apart?"     159/109 2. ONSET: "When did you take your blood pressure?"     Last night 3. HOW: "How did you obtain the blood pressure?" (e.g., visiting nurse, automatic home BP monitor)     BP taken last night while at work 4. HISTORY: "Do you have a history of high blood pressure?"     No 5. MEDICATIONS: "Are you taking any medications for blood pressure?" "Have you missed any doses recently?"     No 6. OTHER SYMPTOMS: "Do you have any symptoms?" (e.g., headache, chest pain, blurred vision, difficulty breathing, weakness)     Blurry vision last night 7. PREGNANCY: "Is there any chance you are pregnant?" "When was your last menstrual period?"  n/a  Protocols used: VISION LOSS OR CHANGE-A-AH, HIGH BLOOD PRESSURE-A-AH

## 2018-03-10 ENCOUNTER — Ambulatory Visit (INDEPENDENT_AMBULATORY_CARE_PROVIDER_SITE_OTHER): Payer: BLUE CROSS/BLUE SHIELD | Admitting: Family Medicine

## 2018-03-10 ENCOUNTER — Encounter: Payer: Self-pay | Admitting: Family Medicine

## 2018-03-10 VITALS — BP 118/78 | HR 87 | Temp 97.9°F | Ht 72.0 in | Wt 233.2 lb

## 2018-03-10 DIAGNOSIS — H539 Unspecified visual disturbance: Secondary | ICD-10-CM

## 2018-03-10 DIAGNOSIS — R030 Elevated blood-pressure reading, without diagnosis of hypertension: Secondary | ICD-10-CM

## 2018-03-10 NOTE — Patient Instructions (Signed)
BEFORE YOU LEAVE: -follow up: schedule transfer patient visit with [facility] instead of Dr. Volanda Napoleon. If > then 1 month out to transfer visit - schedule follow up with [facility] or me in 1 month  -We placed a referral for you as discussed.  If you have not heard from [place] regarding this appointment by early next week then please contact our office.  -please seek emergency care immediately if any further symptoms.

## 2018-03-10 NOTE — Progress Notes (Signed)
HPI:  Using dictation device. Unfortunately this device frequently misinterprets words/phrases.  Dylan Fuller is a pleasant 52 year old with no significant past medical history here for an acute visit for elevated blood pressure.  Looks like his blood pressure has run borderline high in the past.  He reports that he tends to be an anxious person and his blood pressure runs a little high when seeing physicians.  Reports he has had a lot of stress at work.  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  Several days ago, he was at work and he felt like there were some "lines" in his vision in his left eye and his vision felt a little blurry in the left eye.  He became anxious about what this could be and went to see a nurse practitioner at work.  His blood pressure at the time was quite elevated at 159/109.  However he admits he was very anxious.  The symptoms resolved fairly quickly after about 40 minutes.  He has had no symptoms since.  He did not have any headache, loss of vision, speech issues, chest pain, shortness of breath, pain or any other symptoms at the time.  He has no past medical history of hypertension, hyperlipidemia, hyperglycemia or strokes or heart attacks in the family.  He reports he has had the same issue with visual changes in the past, the last episode about a year ago. He is to [employer] [place] him recently retired.  He is set up to see Dr. Volanda Napoleon, but he prefers to see a male provider.  He was going to call and cancel the appointment with Dr. Volanda Napoleon, but opted to talk with [place] today for this visit instead.  He asked about male providers taking patients.  He just feels more comfortable with her diet as he is always seeing a male provider in the past.  ROS: See pertinent positives and negatives per HPI.  Past Medical History:  Diagnosis Date  . Constipation   . Prostatitis, chronic     Past Surgical History:  Procedure Laterality Date  . HERNIA REPAIR     age 1 - ?ingunial x2     Family History  Problem Relation Age of Onset  . Arthritis Mother   . Heart disease Mother   . Hyperlipidemia Mother   . Hypertension Mother   . Heart disease Father   . Colon cancer Neg Hx   . Colon polyps Neg Hx   . Esophageal cancer Neg Hx   . Rectal cancer Neg Hx   . Stomach cancer Neg Hx     SOCIAL HX: See HPI  No current outpatient medications on file.  Current Facility-Administered Medications:  .  0.9 %  sodium chloride infusion, 500 mL, Intravenous, Continuous, Armbruster, Carlota Raspberry, MD  EXAM:  Vitals:   03/10/18 1100  BP: 118/78  Pulse: 87  Temp: 97.9 F (36.6 C)  SpO2: 96%    Body mass index is 31.63 kg/m.  GENERAL: vitals reviewed and listed above, alert, oriented, appears well hydrated and in no acute distress  HEENT: atraumatic, mild conjunctival erythema bilaterally, no obvious abnormalities on inspection of external nose and ears, normal appearance of ear canals and TMs, he has fairly small pupils so a detailed eye exam was difficult, no gross abnormalities on gross exam of bilateral eyes, visual field testing grossly intact. visual acuity grossly intact.  Clear nasal congestion, mild post oropharyngeal erythema with PND, no tonsillar edema or exudate, no sinus TTP  NECK: no  obvious masses on inspection, no carotid bruits  LUNGS: clear to auscultation bilaterally, no wheezes, rales or rhonchi, good air movement  CV: HRRR, no peripheral edema  MS: moves all extremities without noticeable abnormality  PSYCH: pleasant and cooperative, no obvious depression or anxiety  NEURO: Cranial nerves II through XII grossly intact, finger-to-nose is normal, speech and thought processing grossly intact  ASSESSMENT AND PLAN:  Discussed the following assessment and plan:  Visual disturbance - Plan: Ambulatory referral to Ophthalmology  Elevated blood pressure reading  -Asymptomatic today.  We discussed possible serious and likely etiologies, workup and  treatment, treatment risks and return precautions -we talked about serious pathology such as a TIA or stroke versus ophthalmological issues versus migraine versus other.  Neurological exam is  normal today. -after this discussion, [name] opted for stat referral to ophthalmology for dilated eye exam, he declined an MRI or other neuroimaging for now but agrees to seek emergency care and undergo imaging if any further issues -follow up advised in 1 month, sooner if any concerns.  Plans to establish with a new doctor and will need to be doing his basic preventive care labs.  Denies any issues with hypertension, hyperlipidemia or hyperglycemia other than mildly elevated blood pressures at office visits.  His blood pressure is okay today. -of course, we advised [name]  to return or notify a doctor immediately if symptoms worsen or persist or new concerns arise.   Patient Instructions  BEFORE YOU LEAVE: -follow up: schedule transfer patient visit with [facility] instead of Dr. Volanda Napoleon. If > then 1 month out to transfer visit - schedule follow up with [facility] or me in 1 month  -We placed a referral for you as discussed.  If you have not heard from [place] regarding this appointment by early next week then please contact our office.  -please seek emergency care immediately if any further symptoms.     Lucretia Kern, DO

## 2018-04-01 ENCOUNTER — Encounter: Payer: BLUE CROSS/BLUE SHIELD | Admitting: Family Medicine

## 2018-04-07 ENCOUNTER — Ambulatory Visit: Payer: BLUE CROSS/BLUE SHIELD | Admitting: Family Medicine

## 2018-06-01 ENCOUNTER — Encounter: Payer: BLUE CROSS/BLUE SHIELD | Admitting: Adult Health

## 2019-09-27 ENCOUNTER — Encounter (HOSPITAL_COMMUNITY): Payer: Self-pay

## 2019-09-27 ENCOUNTER — Inpatient Hospital Stay (HOSPITAL_COMMUNITY)
Admission: EM | Admit: 2019-09-27 | Discharge: 2019-09-29 | DRG: 066 | Disposition: A | Discharge status: Home / Self Care | Payer: Managed Care, Other (non HMO) | Attending: Family Medicine | Admitting: Family Medicine

## 2019-09-27 ENCOUNTER — Other Ambulatory Visit: Payer: Self-pay

## 2019-09-27 DIAGNOSIS — E785 Hyperlipidemia, unspecified: Secondary | ICD-10-CM | POA: Diagnosis present

## 2019-09-27 DIAGNOSIS — I10 Essential (primary) hypertension: Secondary | ICD-10-CM | POA: Diagnosis present

## 2019-09-27 DIAGNOSIS — N419 Inflammatory disease of prostate, unspecified: Secondary | ICD-10-CM | POA: Diagnosis present

## 2019-09-27 DIAGNOSIS — Z8261 Family history of arthritis: Secondary | ICD-10-CM

## 2019-09-27 DIAGNOSIS — I6389 Other cerebral infarction: Secondary | ICD-10-CM | POA: Diagnosis not present

## 2019-09-27 DIAGNOSIS — Z83438 Family history of other disorder of lipoprotein metabolism and other lipidemia: Secondary | ICD-10-CM

## 2019-09-27 DIAGNOSIS — R9431 Abnormal electrocardiogram [ECG] [EKG]: Secondary | ICD-10-CM | POA: Diagnosis present

## 2019-09-27 DIAGNOSIS — I639 Cerebral infarction, unspecified: Secondary | ICD-10-CM | POA: Diagnosis present

## 2019-09-27 DIAGNOSIS — E669 Obesity, unspecified: Secondary | ICD-10-CM | POA: Diagnosis present

## 2019-09-27 DIAGNOSIS — R29701 NIHSS score 1: Secondary | ICD-10-CM | POA: Diagnosis present

## 2019-09-27 DIAGNOSIS — Z6829 Body mass index (BMI) 29.0-29.9, adult: Secondary | ICD-10-CM

## 2019-09-27 DIAGNOSIS — Z7982 Long term (current) use of aspirin: Secondary | ICD-10-CM

## 2019-09-27 DIAGNOSIS — I6381 Other cerebral infarction due to occlusion or stenosis of small artery: Principal | ICD-10-CM | POA: Diagnosis present

## 2019-09-27 DIAGNOSIS — Z20822 Contact with and (suspected) exposure to covid-19: Secondary | ICD-10-CM | POA: Diagnosis present

## 2019-09-27 DIAGNOSIS — R739 Hyperglycemia, unspecified: Secondary | ICD-10-CM | POA: Diagnosis present

## 2019-09-27 DIAGNOSIS — Z8249 Family history of ischemic heart disease and other diseases of the circulatory system: Secondary | ICD-10-CM

## 2019-09-27 LAB — DIFFERENTIAL
Abs Immature Granulocytes: 0.11 10*3/uL — ABNORMAL HIGH (ref 0.00–0.07)
Basophils Absolute: 0.1 10*3/uL (ref 0.0–0.1)
Basophils Relative: 0 %
Eosinophils Absolute: 0.2 10*3/uL (ref 0.0–0.5)
Eosinophils Relative: 1 %
Immature Granulocytes: 1 %
Lymphocytes Relative: 13 %
Lymphs Abs: 2.1 10*3/uL (ref 0.7–4.0)
Monocytes Absolute: 0.6 10*3/uL (ref 0.1–1.0)
Monocytes Relative: 4 %
Neutro Abs: 13.3 10*3/uL — ABNORMAL HIGH (ref 1.7–7.7)
Neutrophils Relative %: 81 %

## 2019-09-27 LAB — CBC
HCT: 46.5 % (ref 39.0–52.0)
Hemoglobin: 15.6 g/dL (ref 13.0–17.0)
MCH: 29.3 pg (ref 26.0–34.0)
MCHC: 33.5 g/dL (ref 30.0–36.0)
MCV: 87.4 fL (ref 80.0–100.0)
Platelets: 266 10*3/uL (ref 150–400)
RBC: 5.32 MIL/uL (ref 4.22–5.81)
RDW: 12.8 % (ref 11.5–15.5)
WBC: 16.3 10*3/uL — ABNORMAL HIGH (ref 4.0–10.5)
nRBC: 0 % (ref 0.0–0.2)

## 2019-09-27 LAB — I-STAT CHEM 8, ED
BUN: 17 mg/dL (ref 6–20)
Calcium, Ion: 1.19 mmol/L (ref 1.15–1.40)
Chloride: 101 mmol/L (ref 98–111)
Creatinine, Ser: 0.8 mg/dL (ref 0.61–1.24)
Glucose, Bld: 130 mg/dL — ABNORMAL HIGH (ref 70–99)
HCT: 47 % (ref 39.0–52.0)
Hemoglobin: 16 g/dL (ref 13.0–17.0)
Potassium: 3.9 mmol/L (ref 3.5–5.1)
Sodium: 139 mmol/L (ref 135–145)
TCO2: 28 mmol/L (ref 22–32)

## 2019-09-27 LAB — PROTIME-INR
INR: 0.9 (ref 0.8–1.2)
Prothrombin Time: 12.5 seconds (ref 11.4–15.2)

## 2019-09-27 LAB — APTT: aPTT: 25 seconds (ref 24–36)

## 2019-09-27 MED ORDER — SODIUM CHLORIDE 0.9% FLUSH: 3.0000 mL | Freq: Once | INTRAVENOUS | Status: DC

## 2019-09-27 NOTE — ED Triage Notes (Signed)
Pt reports that he was working today and he was having R sided numbness that started at 3pm and some dizziness and nausea. Numbness continues but neuro intact bilaterally and no drift or weakness noted.

## 2019-09-28 ENCOUNTER — Emergency Department (HOSPITAL_COMMUNITY): Payer: Managed Care, Other (non HMO)

## 2019-09-28 ENCOUNTER — Encounter (HOSPITAL_COMMUNITY): Payer: Self-pay | Admitting: Internal Medicine

## 2019-09-28 ENCOUNTER — Inpatient Hospital Stay (HOSPITAL_COMMUNITY): Payer: Managed Care, Other (non HMO)

## 2019-09-28 ENCOUNTER — Encounter (HOSPITAL_COMMUNITY): Payer: Self-pay | Admitting: Nurse Practitioner

## 2019-09-28 ENCOUNTER — Other Ambulatory Visit: Payer: Self-pay

## 2019-09-28 DIAGNOSIS — Z20822 Contact with and (suspected) exposure to covid-19: Secondary | ICD-10-CM | POA: Diagnosis present

## 2019-09-28 DIAGNOSIS — R9431 Abnormal electrocardiogram [ECG] [EKG]: Secondary | ICD-10-CM | POA: Diagnosis present

## 2019-09-28 DIAGNOSIS — Z8249 Family history of ischemic heart disease and other diseases of the circulatory system: Secondary | ICD-10-CM | POA: Diagnosis not present

## 2019-09-28 DIAGNOSIS — Z6829 Body mass index (BMI) 29.0-29.9, adult: Secondary | ICD-10-CM | POA: Diagnosis not present

## 2019-09-28 DIAGNOSIS — I639 Cerebral infarction, unspecified: Secondary | ICD-10-CM | POA: Diagnosis present

## 2019-09-28 DIAGNOSIS — Z8261 Family history of arthritis: Secondary | ICD-10-CM | POA: Diagnosis not present

## 2019-09-28 DIAGNOSIS — Z83438 Family history of other disorder of lipoprotein metabolism and other lipidemia: Secondary | ICD-10-CM | POA: Diagnosis not present

## 2019-09-28 DIAGNOSIS — E669 Obesity, unspecified: Secondary | ICD-10-CM | POA: Diagnosis present

## 2019-09-28 DIAGNOSIS — I6389 Other cerebral infarction: Secondary | ICD-10-CM | POA: Diagnosis present

## 2019-09-28 DIAGNOSIS — Z7982 Long term (current) use of aspirin: Secondary | ICD-10-CM | POA: Diagnosis not present

## 2019-09-28 DIAGNOSIS — I10 Essential (primary) hypertension: Secondary | ICD-10-CM | POA: Diagnosis present

## 2019-09-28 DIAGNOSIS — R739 Hyperglycemia, unspecified: Secondary | ICD-10-CM | POA: Diagnosis present

## 2019-09-28 DIAGNOSIS — N419 Inflammatory disease of prostate, unspecified: Secondary | ICD-10-CM | POA: Diagnosis present

## 2019-09-28 DIAGNOSIS — E785 Hyperlipidemia, unspecified: Secondary | ICD-10-CM | POA: Diagnosis present

## 2019-09-28 DIAGNOSIS — I6381 Other cerebral infarction due to occlusion or stenosis of small artery: Secondary | ICD-10-CM | POA: Diagnosis present

## 2019-09-28 DIAGNOSIS — R29701 NIHSS score 1: Secondary | ICD-10-CM | POA: Diagnosis present

## 2019-09-28 LAB — COMPREHENSIVE METABOLIC PANEL
ALT: 48 U/L — ABNORMAL HIGH (ref 0–44)
AST: 26 U/L (ref 15–41)
Albumin: 4.1 g/dL (ref 3.5–5.0)
Alkaline Phosphatase: 74 U/L (ref 38–126)
Anion gap: 13 (ref 5–15)
BUN: 14 mg/dL (ref 6–20)
CO2: 24 mmol/L (ref 22–32)
Calcium: 9.7 mg/dL (ref 8.9–10.3)
Chloride: 101 mmol/L (ref 98–111)
Creatinine, Ser: 0.86 mg/dL (ref 0.61–1.24)
GFR calc Af Amer: >60 mL/min (ref >60)
GFR calc non Af Amer: >60 mL/min (ref >60)
Glucose, Bld: 139 mg/dL — ABNORMAL HIGH (ref 70–99)
Potassium: 3.8 mmol/L (ref 3.5–5.1)
Sodium: 138 mmol/L (ref 135–145)
Total Bilirubin: 0.9 mg/dL (ref 0.3–1.2)
Total Protein: 7.2 g/dL (ref 6.5–8.1)

## 2019-09-28 LAB — RAPID URINE DRUG SCREEN, HOSP PERFORMED
Amphetamines: NOT DETECTED
Barbiturates: NOT DETECTED
Benzodiazepines: NOT DETECTED
Cocaine: NOT DETECTED
Opiates: NOT DETECTED
Tetrahydrocannabinol: NOT DETECTED

## 2019-09-28 LAB — CBG MONITORING, ED
Glucose-Capillary: 105 mg/dL — ABNORMAL HIGH (ref 70–99)
Glucose-Capillary: 90 mg/dL (ref 70–99)
Glucose-Capillary: 79 mg/dL (ref 70–99)

## 2019-09-28 LAB — ECHOCARDIOGRAM COMPLETE
Height: 72 in
Weight: 3520 oz

## 2019-09-28 LAB — TSH: TSH: 0.861 u[IU]/mL (ref 0.350–4.500)

## 2019-09-28 LAB — SARS CORONAVIRUS 2 (TAT 6-24 HRS): SARS Coronavirus 2: NEGATIVE

## 2019-09-28 LAB — HEMOGLOBIN A1C
Hgb A1c MFr Bld: 5.3 % (ref 4.8–5.6)
Mean Plasma Glucose: 105.41 mg/dL

## 2019-09-28 LAB — HIV ANTIBODY (ROUTINE TESTING W REFLEX): HIV Screen 4th Generation wRfx: NONREACTIVE

## 2019-09-28 MED ORDER — SODIUM CHLORIDE 0.9 % IV BOLUS
1000.0000 mL | Freq: Once | INTRAVENOUS | Status: AC
  Administered 2019-09-28: 1000 mL via INTRAVENOUS

## 2019-09-28 MED ORDER — ENOXAPARIN SODIUM 40 MG/0.4ML ~~LOC~~ SOLN
40.0000 mg | SUBCUTANEOUS | Status: DC
  Administered 2019-09-28: 40 mg via SUBCUTANEOUS

## 2019-09-28 MED ORDER — MECLIZINE HCL 25 MG PO TABS
25.0000 mg | ORAL_TABLET | Freq: Once | ORAL | Status: AC
  Administered 2019-09-28: 25 mg via ORAL
  Filled 2019-09-28: qty 1

## 2019-09-28 MED ORDER — ASPIRIN 300 MG RE SUPP: 300.0000 mg | Freq: Every day | RECTAL | Status: DC

## 2019-09-28 MED ORDER — INSULIN ASPART 100 UNIT/ML ~~LOC~~ SOLN: 0.0000 [IU] | Freq: Three times a day (TID) | SUBCUTANEOUS | Status: DC

## 2019-09-28 MED ORDER — ACETAMINOPHEN 650 MG RE SUPP: 650.0000 mg | RECTAL | Status: DC | PRN

## 2019-09-28 MED ORDER — CLOPIDOGREL BISULFATE 300 MG PO TABS
300.0000 mg | ORAL_TABLET | Freq: Once | ORAL | Status: AC
  Administered 2019-09-28: 300 mg via ORAL
  Filled 2019-09-28: qty 1

## 2019-09-28 MED ORDER — ACETAMINOPHEN 160 MG/5ML PO SOLN: 650.0000 mg | ORAL | Status: DC | PRN

## 2019-09-28 MED ORDER — ASPIRIN 325 MG PO TABS
325.0000 mg | ORAL_TABLET | Freq: Every day | ORAL | Status: DC
  Administered 2019-09-28: 325 mg via ORAL
  Filled 2019-09-28: qty 1

## 2019-09-28 MED ORDER — ACETAMINOPHEN 325 MG PO TABS: 650.0000 mg | ORAL_TABLET | ORAL | Status: DC | PRN

## 2019-09-28 MED ORDER — CLOPIDOGREL BISULFATE 75 MG PO TABS
75.0000 mg | ORAL_TABLET | Freq: Every day | ORAL | Status: DC
  Administered 2019-09-29: 75 mg via ORAL
  Filled 2019-09-28: qty 1

## 2019-09-28 MED ORDER — IOHEXOL 350 MG/ML SOLN
75.0000 mL | Freq: Once | INTRAVENOUS | Status: AC | PRN
  Administered 2019-09-28: 75 mL via INTRAVENOUS

## 2019-09-28 MED ORDER — STROKE: EARLY STAGES OF RECOVERY BOOK: Freq: Once | Status: DC

## 2019-09-28 MED ORDER — ATORVASTATIN CALCIUM 40 MG PO TABS
40.0000 mg | ORAL_TABLET | Freq: Every day | ORAL | Status: DC
  Administered 2019-09-28 – 2019-09-29 (×2): 40 mg via ORAL
  Filled 2019-09-28 (×2): qty 1

## 2019-09-28 MED ORDER — SENNOSIDES-DOCUSATE SODIUM 8.6-50 MG PO TABS: 1.0000 | ORAL_TABLET | Freq: Every evening | ORAL | Status: DC | PRN

## 2019-09-28 MED ORDER — SODIUM CHLORIDE 0.9 % IV SOLN: INTRAVENOUS | Status: DC

## 2019-09-28 NOTE — ED Notes (Signed)
Pt to room 11, alert and oriented with NAD.  Denies any current pain or dizziness.  States sx have resolved and only noted minor tingling to R fingers.  Updated on POC with no needs noted at this time.

## 2019-09-28 NOTE — Progress Notes (Deleted)
Neurology Consultation Reason for Consult: Right side weakness Referring Physician: Marletta Lor,  CC: Patient  is a  54 year  old male. He is here for right side weakness and numbness.The patient's medications were reviewed and reconciled.  History details were provided by the patient. The history appears to be reliable.      HPI: Dylan Fuller is a 54 y.o. male pt is a 54 year old male who recently reports stroke like symptoms. Patient was at work when he started feeling dizzy and his balance was altered. He was able to drive home however patient continued to experience increase numbness on his right side radiating down to his right arms and legs, with elevated blood pressure. He was transported to the Ed by family when symptoms did not resolve. Pt is not aware of prior history of stroke he reports slight nausea, denies headaches, chest pain or tightness. patient is not currently on any blood pressure medication or antiplatelets.    LKW: 2.30 pm  tpa given?: no, not applicable to patients situation  ROS: A 14 point ROS was performed and is negative except as noted in the HPI.   Review of Systems  Constitutional: Negative.  Negative for chills and fever.  HENT: Negative.   Eyes: Negative for blurred vision, double vision, photophobia and redness.  Respiratory: Negative.   Cardiovascular: Negative for chest pain, palpitations and leg swelling.  Gastrointestinal: Positive for nausea. Negative for abdominal pain, diarrhea and vomiting.  Genitourinary: Negative.   Musculoskeletal: Negative.   Skin: Negative.   Neurological: Positive for dizziness. Negative for tingling and headaches.       Right facial numbness  Psychiatric/Behavioral: Negative.       Past Medical History:  Diagnosis Date  . Constipation   . Prostatitis, chronic     Family History  Problem Relation Age of Onset  . Arthritis Mother   . Heart disease Mother   . Hyperlipidemia Mother   . Hypertension  Mother   . Heart disease Father   . Colon cancer Neg Hx   . Colon polyps Neg Hx   . Esophageal cancer Neg Hx   . Rectal cancer Neg Hx   . Stomach cancer Neg Hx     Social History:  reports that he has never smoked. He has never used smokeless tobacco. He reports current alcohol use. He reports that he does not use drugs.  Exam: Current vital signs: There were no vitals taken for this visit. Vital signs in last 24 hours: @VSRANGES @ Physical Exam Physical Exam  Constitutional: He is oriented to person, place, and time.  HENT:  Mouth/Throat: Oropharynx is clear and moist.  Musculoskeletal:     Cervical back: Neck supple.  Neurological: He is alert and oriented to person, place, and time.  Skin: Skin is warm. No rash noted.  Constitutional: Appears well-developed and well-nourished.  Psych: Affect appropriate to situation Eyes: No scleral injection HENT: No OP obstrucion MSK: no joint deformities.  Cardiovascular: Normal rate and regular rhythm.  Respiratory: Effort normal, non-labored breathing GI: Soft.  No distension. There is no tenderness.  Skin: WDI  Neuro: Mental Status: Patient is awake, alert, oriented to person, place, month, year, and situation. Patient is able to give a clear and coherent history. No signs of aphasia or neglect Cranial Nerves: II: Visual Fields are full. Pupils are equal, round, and reactive to light.   III,IV, VI: EOMI without ptosis or diploplia.  V: Facial sensation is symmetric to temperature  VII: Facial movement is symmetric.  VIII: hearing is intact to voice X: Uvula elevates symmetrically XI: Shoulder shrug is symmetric. XII: tongue is midline without atrophy or fasciculations.  Motor: Tone is normal. Bulk is normal. 5/5 strength was present in all four extremities. Sensory: Sensation is symmetric to light touch and temperature in the arms and legs. Slight numbness on right side Deep Tendon Reflexes: 2+ and symmetric in the biceps  and patellae.  Plantars: Toes are downgoing bilaterally. Cerebellar: FNF and HKS are intact bilaterally   I have reviewed labs in epic and the results pertinent to this consultation are: -WBC 16.3 -Neutro 13.3  I have reviewed the images obtained:  MRI HEAD WITHOUT CONTRAST Brain: There is a small 7 mm oval/linear focus of abnormal trace diffusion tracking toward the posterior right lentiform nuclei from the posterior right corona radiata (series 5, image 81 and series 7, image 48). This appears isointense on ADC, with associated well developed T2/FLAIR hyperintensity and some T1 hypointensity. No acute hemorrhage or mass effect.  Additionally there is a 6 mm linear focus of restricted diffusion in the dorsal left pons on series 5, image 65 and series 7, image 45. This has faint T2 and FLAIR hyperintensity, with no hemorrhage or  mass effect.  CT HEAD WITHOUT CONTRAST Brain: Focal hypodensities in the right basal ganglia and right parietal periventricular white matter consistent with chronic lacunar infarcts. Otherwise no acute infarct or hemorrhage. Lateral ventricles and remaining midline structures are unremarkable. No acute extra-axial fluid collections. No mass effect.  Vascular: No hyperdense vessel or unexpected calcification.  Skull: Normal. Negative for fracture or focal lesion.  Sinuses/Orbits: Mild mucoperiosteal thickening within the ethmoid air cells. Remaining paranasal sinuses are clear.    Impression:   Patient is a 54 year old male with stroke like symptoms with right side and facial numbness...Marland KitchenMarland KitchenMarland Kitchen  Recommendations: 1) subacute stroke work up 3) Echo 2) PT/OT 3) Anticoagulant therapy    @MPKSIGN @

## 2019-09-28 NOTE — ED Notes (Signed)
Pt ate [employer] for lunch

## 2019-09-28 NOTE — Consult Note (Addendum)
Neurology Consultation Reason for Consult: Stroke Referring Physician: Shelly Coss, H  CC: Right-sided weakness  History is obtained from: Patient, wife  HPI: Dylan Fuller is a 54 y.o. male pt is a 54 year old male who recently reports stroke like symptoms. Patient was at work when he started feeling dizzy and his balance was altered. He was able to drive home however patient continued to experience increase numbness on his right side radiating down to his right arms and legs, with elevated blood pressure. He was transported to the Ed by family when symptoms did not resolve. Pt is not aware of prior history of stroke he reports slight nausea, denies headaches, chest pain or tightness. patient is not currently on any blood pressure medication or antiplatelets.    LKW: 2:30 PM 4/14 tpa given?: no, outside window   ROS: A 14 point ROS was performed and is negative except as noted in the HPI.  Past Medical History:  Diagnosis Date  . Constipation   . Prostatitis, chronic      Family History  Problem Relation Age of Onset  . Arthritis Mother   . Heart disease Mother   . Hyperlipidemia Mother   . Hypertension Mother   . Dementia Mother   . Heart disease Father        46; post-rheumatic fever valve disease  . Colon cancer Neg Hx   . Colon polyps Neg Hx   . Esophageal cancer Neg Hx   . Rectal cancer Neg Hx   . Stomach cancer Neg Hx   . Stroke Neg Hx      Social History:  reports that he has never smoked. He has never used smokeless tobacco. He reports current alcohol use. He reports that he does not use drugs.   Exam: Current vital signs: BP 122/83   Pulse 81   Temp 98.8 F (37.1 C) (Oral)   Resp 16   Ht 6' (1.829 m)   Wt 99.8 kg   SpO2 95%   BMI 29.84 kg/m  Vital signs in last 24 hours: Temp:  [98.8 F (37.1 C)] 98.8 F (37.1 C) (04/15 0728) Pulse Rate:  [75-101] 81 (04/15 1730) Resp:  [13-27] 16 (04/15 1730) BP: (118-152)/(77-103) 122/83 (04/15 1730) SpO2:  [94  %-99 %] 95 % (04/15 1730) Weight:  [99.8 kg] 99.8 kg (04/14 2252)   Physical Exam  Constitutional: Appears well-developed and well-nourished.  Psych: Affect appropriate to situation Eyes: No scleral injection HENT: No OP obstrucion MSK: no joint deformities.  Cardiovascular: Normal rate and regular rhythm.  Respiratory: Effort normal, non-labored breathing GI: Soft.  No distension. There is no tenderness.  Skin: WDI  Neuro: Mental Status: Patient is awake, alert, oriented to person, place, month, year, and situation. Patient is able to give a clear and coherent history. No signs of aphasia or neglect Cranial Nerves: II: Visual Fields are full. Pupils are equal, round, and reactive to light.   III,IV, VI: EOMI without ptosis or diploplia.  V: Facial sensation is symmetric to temperature VII: Facial movement with mildly decreased nasolabial fold on the right VIII: hearing is intact to voice X: Uvula elevates symmetrically XI: Shoulder shrug is symmetric. XII: tongue is midline without atrophy or fasciculations.  Motor: Tone is normal. Bulk is normal.  4/5 strength in the right arm and leg Sensory: Sensation is diminished in the right  Cerebellar: FNF intact on the left   I have reviewed labs in epic and the results pertinent to this consultation  are: Creatinine 0.86  I have reviewed the images obtained: 2 strokes which look like concurrent small vessel disease.  Impression: 54 year old male with small lacunar infarcts is being admitted for secondary risk factor modification and physical therapy.  Recommendations: - HgbA1c, fasting lipid panel -CTA head neck - Frequent neuro checks - Echocardiogram - Prophylactic therapy-dual antiplatelet therapy x3 weeks followed by monotherapy. - Risk factor modification - Telemetry monitoring - PT consult, OT consult, Speech consult - Stroke team to follow   Roland Rack, MD [facility] [phone]  If  7pm- 7am, please page neurology on call as listed in [place].

## 2019-09-28 NOTE — ED Notes (Signed)
Found pt in lobby, pt was in CT

## 2019-09-28 NOTE — ED Provider Notes (Signed)
[facility] EMERGENCY DEPARTMENT Provider Note   CSN: [id number] Arrival date & time: 09/27/19  2113     History Chief Complaint  Patient presents with  . Numbness    Dylan Fuller is a 54 y.o. male who presents to ED with a chief complaint of dizziness and right-sided numbness.  Approximately 16 hours ago while at work, began experiencing sudden onset of dizziness and feeling "off balance."  Symptoms have been persistent since they began.  Also reports numbness and "decreased sensation" to the right side of his body including his right face, neck and paresthesias in his right hand.  He denies history of similar symptoms in the past.  He had his blood pressure checked while at work and was found to be high.  He denies history of hypertension or diabetes.  Denies any recent injuries or falls.  Denies any vision changes, vomiting but does endorse nausea.  Denies headache, chest pain, shortness of breath, fever, neck pain or stiffness, history of prior stroke or TIA.  HPI     Past Medical History:  Diagnosis Date  . Constipation   . Prostatitis, chronic     Patient Active Problem List   Diagnosis Date Noted  . History of colonic polyps 01/13/2017    Past Surgical History:  Procedure Laterality Date  . HERNIA REPAIR     age 82 - ?ingunial x2       Family History  Problem Relation Age of Onset  . Arthritis Mother   . Heart disease Mother   . Hyperlipidemia Mother   . Hypertension Mother   . Heart disease Father   . Colon cancer Neg Hx   . Colon polyps Neg Hx   . Esophageal cancer Neg Hx   . Rectal cancer Neg Hx   . Stomach cancer Neg Hx     Social History   Tobacco Use  . Smoking status: Never Smoker  . Smokeless tobacco: Never Used  . Tobacco comment: pt states he has smoked a few cigarettes in his lifetime only   Substance Use Topics  . Alcohol use: Yes    Comment: social  . Drug use: No    Home Medications Prior to Admission  medications   Not on File    Allergies    Patient has no known allergies.  Review of Systems   Review of Systems  Constitutional: Negative for appetite change, chills and fever.  HENT: Negative for ear pain, rhinorrhea, sneezing and sore throat.   Eyes: Negative for photophobia and visual disturbance.  Respiratory: Negative for cough, chest tightness, shortness of breath and wheezing.   Cardiovascular: Negative for chest pain and palpitations.  Gastrointestinal: Negative for abdominal pain, blood in stool, constipation, diarrhea, nausea and vomiting.  Genitourinary: Negative for dysuria, hematuria and urgency.  Musculoskeletal: Negative for myalgias.  Skin: Negative for rash.  Neurological: Positive for dizziness and numbness. Negative for weakness and light-headedness.    Physical Exam Updated Vital Signs BP (!) 143/101   Pulse 76   Temp 98.8 F (37.1 C) (Oral)   Resp 15   Ht 6' (1.829 m)   Wt 99.8 kg   SpO2 97%   BMI 29.84 kg/m   Physical Exam Vitals and nursing note reviewed.  Constitutional:      General: He is not in acute distress.    Appearance: He is well-developed.  HENT:     Head: Normocephalic and atraumatic.     Nose: Nose normal.  Eyes:  General: No scleral icterus.       Right eye: No discharge.        Left eye: No discharge.     Conjunctiva/sclera: Conjunctivae normal.     Pupils: Pupils are equal, round, and reactive to light.  Cardiovascular:     Rate and Rhythm: Normal rate and regular rhythm.     Heart sounds: Normal heart sounds. No murmur. No friction rub. No gallop.   Pulmonary:     Effort: Pulmonary effort is normal. No respiratory distress.     Breath sounds: Normal breath sounds.  Abdominal:     General: Bowel sounds are normal. There is no distension.     Palpations: Abdomen is soft.     Tenderness: There is no abdominal tenderness. There is no guarding.  Musculoskeletal:        General: Normal range of motion.     Cervical  back: Normal range of motion and neck supple.  Skin:    General: Skin is warm and dry.     Findings: No rash.  Neurological:     General: No focal deficit present.     Mental Status: He is alert and oriented to person, place, and time.     Cranial Nerves: No cranial nerve deficit.     Sensory: Sensory deficit present.     Motor: No weakness or abnormal muscle tone.     Coordination: Coordination normal.     Comments: Pupils reactive. No facial asymmetry noted. Cranial nerves appear grossly intact. Sensation intact to light touch on face, BUE and BLE (although reports decreased sensation to R side with light touch). Strength 5/5 in BUE and BLE. Normal finger to nose and heel to shin bilaterally.     ED Results / Procedures / Treatments   Labs (all labs ordered are listed, but only abnormal results are displayed) Labs Reviewed  CBC - Abnormal; Notable for the following components:      Result Value   WBC 16.3 (*)    All other components within normal limits  DIFFERENTIAL - Abnormal; Notable for the following components:   Neutro Abs 13.3 (*)    Abs Immature Granulocytes 0.11 (*)    All other components within normal limits  COMPREHENSIVE METABOLIC PANEL - Abnormal; Notable for the following components:   Glucose, Bld 139 (*)    ALT 48 (*)    All other components within normal limits  I-STAT CHEM 8, ED - Abnormal; Notable for the following components:   Glucose, Bld 130 (*)    All other components within normal limits  CBG MONITORING, ED - Abnormal; Notable for the following components:   Glucose-Capillary 105 (*)    All other components within normal limits  PROTIME-INR  APTT    EKG EKG Interpretation  Date/Time:  Wednesday September 27 2019 23:05:11 EDT Ventricular Rate:  72 PR Interval:  170 QRS Duration: 94 QT Interval:  408 QTC Calculation: 446 R Axis:   65 Text Interpretation: Normal sinus rhythm Minimal voltage criteria for LVH, may be normal variant ( Sokolow-Lyon  ) Nonspecific ST and T wave abnormality Abnormal ECG No old tracing to compare Confirmed by Delora Fuel ([id number]) on 09/28/2019 12:36:18 AM   Radiology CT HEAD WO CONTRAST  Result Date: 09/28/2019 CLINICAL DATA:  Right-sided numbness and tingling in face and right upper extremity, dizziness EXAM: CT HEAD WITHOUT CONTRAST TECHNIQUE: Contiguous axial images were obtained from the base of the skull through the vertex without intravenous contrast. COMPARISON:  02/23/2004 FINDINGS: Brain: Focal hypodensities in the right basal ganglia and right parietal periventricular white matter consistent with chronic lacunar infarcts. Otherwise no acute infarct or hemorrhage. Lateral ventricles and remaining midline structures are unremarkable. No acute extra-axial fluid collections. No mass effect. Vascular: No hyperdense vessel or unexpected calcification. Skull: Normal. Negative for fracture or focal lesion. Sinuses/Orbits: Mild mucoperiosteal thickening within the ethmoid air cells. Remaining paranasal sinuses are clear. Other: None IMPRESSION: 1. Chronic lacunar infarcts right basal ganglia and parietal periventricular white matter. 2. No acute intracranial process. Electronically Signed   By: Randa Ngo M.D.   On: 09/28/2019 00:27   MR BRAIN WO CONTRAST  Result Date: 09/28/2019 CLINICAL DATA:  54 year old male with right side numbness and tingling in the face and upper extremity. Dizziness. EXAM: MRI HEAD WITHOUT CONTRAST TECHNIQUE: Multiplanar, multiecho pulse sequences of the brain and surrounding structures were obtained without intravenous contrast. COMPARISON:  Head CT earlier today. FINDINGS: Brain: There is a small 7 mm oval/linear focus of abnormal trace diffusion tracking toward the posterior right lentiform nuclei from the posterior right corona radiata (series 5, image 81 and series 7, image 48). This appears isointense on ADC, with associated well developed T2/FLAIR hyperintensity and some T1  hypointensity. No acute hemorrhage or mass effect. Additionally there is a 6 mm linear focus of restricted diffusion in the dorsal left pons on series 5, image 65 and series 7, image 45. This has faint T2 and FLAIR hyperintensity, with no hemorrhage or mass effect. No other restricted diffusion, but there are fairly numerous areas of chronic lacunar infarct in the bilateral basal ganglia (series 10, image 15) scattered in the bilateral cerebral white matter, and associated with fairly numerous chronic microhemorrhages - scattered throughout the brain but clustered in the deep gray nuclei and also the left occipital lobe. No superimposed cortical encephalomalacia identified. Aside from the acute finding on the left the brainstem is spared. The cerebellum appears normal. No midline shift, mass effect, evidence of mass lesion, ventriculomegaly, extra-axial collection or acute intracranial hemorrhage. Cervicomedullary junction and pituitary are within normal limits. Vascular: Major intracranial vascular flow voids are preserved. Skull and upper cervical spine: Negative visible cervical spine, bone marrow signal. Sinuses/Orbits: Negative orbits. Mild paranasal sinus mucosal thickening. Other: Mastoids are clear. Visible internal auditory structures appear normal. Scalp and face soft tissues appear negative. IMPRESSION: 1. Small acute lacunar infarct in the dorsal left pons with no hemorrhage or mass effect. 2. Subacute lacunar infarct in the right corona radiata/posterior lentiform, with no hemorrhage or mass effect. 3. Underlying advanced chronic small vessel disease including fairly numerous chronic microhemorrhages scattered in the brain. Electronically Signed   By: Genevie Ann M.D.   On: 09/28/2019 08:57    Procedures Procedures (including critical care time)  Medications Ordered in ED Medications  sodium chloride flush (NS) 0.9 % injection 3 mL (has no administration in time range)  sodium chloride 0.9 % bolus  1,000 mL (has no administration in time range)  meclizine (ANTIVERT) tablet 25 mg (25 mg Oral Given 09/28/19 0756)    ED Course  I have reviewed the triage vital signs and the nursing notes.  Pertinent labs & imaging results that were available during my care of the patient were reviewed by me and considered in my medical decision making (see chart for details).  Clinical Course as of Sep 27 904  Thu Sep 28, 2019  0727 Spoke to Dr. Leonel Ramsay, neurologist who recommends MRI of the brain.   [HK]  [id number] Oral temp 97.1F   [HK]    Clinical Course User Index [HK] Delia Heady, PA-C   MDM Rules/Calculators/A&P                      54 year old male presents to ED with a chief complaint of dizziness and right-sided numbness.  Approximately 16 hours ago while at work, began experiencing sudden onset of dizziness and feeling "off balance."  Reports decreased sensation to the right side of his body including his right face, neck and paresthesias to his right hand.  Symptoms began with sudden onset.  He denies history of similar symptoms or stroke in the past.  On exam patient has no weakness, facial asymmetry noted.  He does report decreased sensation to light touch of right upper and lower extremity as well as face. PERRL. Denies chest pain, lungs are clear to auscultation bilaterally.  No meningeal signs.  Denies headache or blurry vision.  Lab work here is unremarkable, leukocytosis of 16.3 which could be reactive.  EKG without any ischemic changes.  CT showing chronic infarcts.  MRI of the brain was done per neurology request which shows acute lacunar infarct as well as subacute infarcts and chronic microhemorrhages.  Patient evaluated by Dr. Leonel Ramsay. Will admit for further management.  MDM Number of Diagnoses or Management Options Cerebrovascular accident (CVA), unspecified mechanism ([place]): new, needed workup   Amount and/or Complexity of Data Reviewed Clinical lab tests: ordered and  reviewed Tests in the radiology section of CPT: ordered and reviewed Tests in the medicine section of CPT: ordered and reviewed Obtain history from someone other than the patient: yes Review and summarize past medical records: yes Discuss the patient with other providers: yes Independent visualization of images, tracings, or specimens: yes  Risk of Complications, Morbidity, and/or Mortality Presenting problems: high Diagnostic procedures: high Management options: high  Critical Care Total time providing critical care: 30-74 minutes  Patient Progress Patient progress: stable   Final Clinical Impression(s) / ED Diagnoses Final diagnoses:  Cerebrovascular accident (CVA), unspecified mechanism ([place])    Rx / DC Orders ED Discharge Orders    None     Portions of this note were generated with Dragon dictation software. Dictation errors may occur despite best attempts at proofreading.    Delia Heady, PA-C 09/28/19 1137    Virgel Manifold, MD 09/30/19 [phone]

## 2019-09-28 NOTE — ED Notes (Signed)
Called pt for vitals x 3 with no answer

## 2019-09-28 NOTE — ED Notes (Signed)
Pt transported to MRI 

## 2019-09-28 NOTE — H&P (Signed)
History and Physical    Dylan Fuller [id number] DOB: 11/23/1965 DOA: 09/27/2019  PCP: Marletta Lor, MD - last saw doctor a couple of years ago Consultants:  [place]; [facility] Patient coming from:  Home - lives with wife and son; [place]: Wife, Dylan Fuller [phone]  Chief Complaint: R-sided numbness  HPI: Dylan Fuller is a 54 y.o. male with medical history significant of prostatitis and constipation presenting with R-sided numbness.  He reports that yesterday about 230pm he noticed balance difficulty and R arm numbness.  Had BP checked and it was a little high but normalized.  It happened again, BP still high.  He went home and the numbness extended up onto the side of his face, neck, and R leg.  Also with some nausea.  His R neck and arm still feel different.  He is able to walk.  No weakness, diplopia, dysphagia.  He did have some R-sided tongue tingling but did not have dysarthria.      ED Course:  Face, neck, R hand numbness yesterday.  Still dizzy and decreased sensation on the right.  Neuro will consult.  Review of Systems: As per HPI; otherwise review of systems reviewed and negative.   Ambulatory Status:  Ambulates without assistance  COVID Vaccine Status:   Complete  Past Medical History:  Diagnosis Date  . Constipation   . Prostatitis, chronic     Past Surgical History:  Procedure Laterality Date  . HERNIA REPAIR     age 1 - ?ingunial x2    Social History   Socioeconomic History  . Marital status: Married    Spouse name: Not on file  . Number of children: Not on file  . Years of education: Not on file  . Highest education level: Not on file  Occupational History  . Occupation: pharmacist  Tobacco Use  . Smoking status: Never Smoker  . Smokeless tobacco: Never Used  . Tobacco comment: pt states he has smoked a few cigarettes in his lifetime only   Substance and Sexual Activity  . Alcohol use: Yes    Comment: social  . Drug use:  No  . Sexual activity: Not on file  Other Topics Concern  . Not on file  Social History Narrative  . Not on file   Social Determinants of Health   Financial Resource Strain:   . Difficulty of Paying Living Expenses:   Food Insecurity:   . Worried About Charity fundraiser in the Last Year:   . Arboriculturist in the Last Year:   Transportation Needs:   . Film/video editor (Medical):   Marland Kitchen Lack of Transportation (Non-Medical):   Physical Activity:   . Days of Exercise per Week:   . Minutes of Exercise per Session:   Stress:   . Feeling of Stress :   Social Connections:   . Frequency of Communication with Friends and Family:   . Frequency of Social Gatherings with Friends and Family:   . Attends Religious Services:   . Active Member of Clubs or Organizations:   . Attends Archivist Meetings:   Marland Kitchen Marital Status:   Intimate Partner Violence:   . Fear of Current or Ex-Partner:   . Emotionally Abused:   Marland Kitchen Physically Abused:   . Sexually Abused:     No Known Allergies  Family History  Problem Relation Age of Onset  . Arthritis Mother   . Heart disease Mother   .  Hyperlipidemia Mother   . Hypertension Mother   . Dementia Mother   . Heart disease Father        51; post-rheumatic fever valve disease  . Colon cancer Neg Hx   . Colon polyps Neg Hx   . Esophageal cancer Neg Hx   . Rectal cancer Neg Hx   . Stomach cancer Neg Hx   . Stroke Neg Hx     Prior to Admission medications   Not on File    Physical Exam: Vitals:   09/28/19 1215 09/28/19 1230 09/28/19 1245 09/28/19 1300  BP: (!) 130/96 129/77 (!) 142/87 (!) 141/86  Pulse: 85 85 81 78  Resp: 14 16 15 13   Temp:      TempSrc:      SpO2: 96%  94% 97%  Weight:      Height:         . General:  Appears calm and comfortable and is NAD . Eyes:  R anisocoria, EOMI, normal lids, iris . ENT:  grossly normal hearing, lips & tongue, mmm; appropriate dentition . Neck:  no LAD, masses or thyromegaly;  no carotid bruits . Cardiovascular:  RRR, no m/r/g. No LE edema.  Marland Kitchen Respiratory:   CTA bilaterally with no wheezes/rales/rhonchi.  Normal respiratory effort. . Abdomen:  soft, NT, ND, NABS . Back:   normal alignment, no CVAT . Skin:  no rash or induration seen on limited exam . Musculoskeletal:  grossly normal tone BUE/BLE, good ROM, no bony abnormality . Lower extremity:  No LE edema.  Limited foot exam with no ulcerations.  2+ distal pulses. Marland Kitchen Psychiatric:  grossly normal mood and affect, speech fluent and appropriate, AOx3 . Neurologic:  CN 2-12 grossly intact, moves all extremities in coordinated fashion, sensation mildly diminished on entire R face and body    Radiological Exams on Admission: CT HEAD WO CONTRAST  Result Date: 09/28/2019 CLINICAL DATA:  Right-sided numbness and tingling in face and right upper extremity, dizziness EXAM: CT HEAD WITHOUT CONTRAST TECHNIQUE: Contiguous axial images were obtained from the base of the skull through the vertex without intravenous contrast. COMPARISON:  02/23/2004 FINDINGS: Brain: Focal hypodensities in the right basal ganglia and right parietal periventricular white matter consistent with chronic lacunar infarcts. Otherwise no acute infarct or hemorrhage. Lateral ventricles and remaining midline structures are unremarkable. No acute extra-axial fluid collections. No mass effect. Vascular: No hyperdense vessel or unexpected calcification. Skull: Normal. Negative for fracture or focal lesion. Sinuses/Orbits: Mild mucoperiosteal thickening within the ethmoid air cells. Remaining paranasal sinuses are clear. Other: None IMPRESSION: 1. Chronic lacunar infarcts right basal ganglia and parietal periventricular white matter. 2. No acute intracranial process. Electronically Signed   By: Randa Ngo M.D.   On: 09/28/2019 00:27   MR BRAIN WO CONTRAST  Result Date: 09/28/2019 CLINICAL DATA:  54 year old male with right side numbness and tingling in the face  and upper extremity. Dizziness. EXAM: MRI HEAD WITHOUT CONTRAST TECHNIQUE: Multiplanar, multiecho pulse sequences of the brain and surrounding structures were obtained without intravenous contrast. COMPARISON:  Head CT earlier today. FINDINGS: Brain: There is a small 7 mm oval/linear focus of abnormal trace diffusion tracking toward the posterior right lentiform nuclei from the posterior right corona radiata (series 5, image 81 and series 7, image 48). This appears isointense on ADC, with associated well developed T2/FLAIR hyperintensity and some T1 hypointensity. No acute hemorrhage or mass effect. Additionally there is a 6 mm linear focus of restricted diffusion in the dorsal left pons  on series 5, image 65 and series 7, image 45. This has faint T2 and FLAIR hyperintensity, with no hemorrhage or mass effect. No other restricted diffusion, but there are fairly numerous areas of chronic lacunar infarct in the bilateral basal ganglia (series 10, image 15) scattered in the bilateral cerebral white matter, and associated with fairly numerous chronic microhemorrhages - scattered throughout the brain but clustered in the deep gray nuclei and also the left occipital lobe. No superimposed cortical encephalomalacia identified. Aside from the acute finding on the left the brainstem is spared. The cerebellum appears normal. No midline shift, mass effect, evidence of mass lesion, ventriculomegaly, extra-axial collection or acute intracranial hemorrhage. Cervicomedullary junction and pituitary are within normal limits. Vascular: Major intracranial vascular flow voids are preserved. Skull and upper cervical spine: Negative visible cervical spine, bone marrow signal. Sinuses/Orbits: Negative orbits. Mild paranasal sinus mucosal thickening. Other: Mastoids are clear. Visible internal auditory structures appear normal. Scalp and face soft tissues appear negative. IMPRESSION: 1. Small acute lacunar infarct in the dorsal left pons  with no hemorrhage or mass effect. 2. Subacute lacunar infarct in the right corona radiata/posterior lentiform, with no hemorrhage or mass effect. 3. Underlying advanced chronic small vessel disease including fairly numerous chronic microhemorrhages scattered in the brain. Electronically Signed   By: Genevie Ann M.D.   On: 09/28/2019 08:57    EKG: Independently reviewed.  NSR with rate 76; nonspecific ST changes with no evidence of acute ischemia   Labs on Admission: I have personally reviewed the available labs and imaging studies at the time of the admission.  Pertinent labs:   Glucose 139 AST 26/ALT 48 WBC 16.3 INR 0.9   Assessment/Plan Principal Problem:   CVA (cerebral vascular accident) ([place])   CVA -Patient presenting with acute onset of R-sided numbness yesterday, concerning for TIA/CVA -CT with evidence of old infarcts, but MRI confirms new infarcts -Will admit for further CVA evaluation -Telemetry monitoring -CTA head and neck -Echo -Risk stratification with FLP, A1c; will also check TSH and UDS -ASA daily after several weeks of DAPT -Neurology consult -PT/OT/ST/Nutrition Consults  HTN -Allow permissive HTN for now -Treat BP only if >220/120, and then with goal of 15% reduction -Patient has not previously been on medication for this issue but appears likely to need it at or shortly after discharge   HLD -Check FLP -Start empiric Lipitor 40 mg daily   Hyperglycemia -Check A1c -Will order moderate-scale SSI   Overweight -Body mass index is 29.84 kg/m. -Weight loss encouraged.   Note: This patient has been tested and is pending for the novel coronavirus COVID-19.     DVT prophylaxis:  Lovenox  Code Status: Full - confirmed with patient/family Family Communication: Wife present throughout evaluation Disposition Plan:  The patient is from: home  Anticipated d/c is to: home without [facility] services once his neurology issues have been resolved.  Anticipated d/c  date will depend on clinical response to treatment, but possibly as early as tomorrow  Patient is currently: acutely ill but stable currently Consults called: Neurology; PT/OT/ST/Nutrition  Admission status: Admit - It is my clinical opinion that admission to INPATIENT is reasonable and necessary because of the expectation that this patient will require hospital care that crosses at least 2 midnights to treat this condition based on the medical complexity of the problems presented.  Given the aforementioned information, the predictability of an adverse outcome is felt to be significant.     Karmen Bongo MD [facility]  How to contact the [facility] Attending or Consulting provider [place] or covering provider during after hours [place], for this patient?  1. Check the care team in [facility] and look for a) attending/consulting [facility] provider listed and b) the [facility] team listed 2. Log into [email or web address] and use [place]'s universal password to access. If you do not have the password, please contact the hospital operator. 3. Locate the [facility] provider you are looking for under [facility] and page to a number that you can be directly reached. 4. If you still have difficulty reaching the provider, please page the [facility] (Director on Call) for the Hospitalists listed on amion for assistance.   09/28/2019, 1:11 PM

## 2019-09-28 NOTE — Progress Notes (Signed)
  Echocardiogram 2D Echocardiogram has been performed.  Haytham Maher A Markise Haymer 09/28/2019, 1:12 PM

## 2019-09-28 NOTE — ED Notes (Signed)
Pt given graham crackers.

## 2019-09-29 DIAGNOSIS — I639 Cerebral infarction, unspecified: Secondary | ICD-10-CM | POA: Diagnosis not present

## 2019-09-29 LAB — LIPID PANEL
Cholesterol: 176 mg/dL (ref 0–200)
HDL: 30 mg/dL — ABNORMAL LOW (ref >40)
LDL Cholesterol: 114 mg/dL — ABNORMAL HIGH (ref 0–99)
Total CHOL/HDL Ratio: 5.9 RATIO
Triglycerides: 160 mg/dL — ABNORMAL HIGH (ref <150)
VLDL: 32 mg/dL (ref 0–40)

## 2019-09-29 LAB — CBG MONITORING, ED
Glucose-Capillary: 92 mg/dL (ref 70–99)
Glucose-Capillary: 101 mg/dL — ABNORMAL HIGH (ref 70–99)
Glucose-Capillary: 88 mg/dL (ref 70–99)

## 2019-09-29 MED ORDER — ASPIRIN EC 81 MG PO TBEC: 81.0000 mg | DELAYED_RELEASE_TABLET | Freq: Every day | ORAL | 0 refills | Status: DC

## 2019-09-29 MED ORDER — CLOPIDOGREL BISULFATE 75 MG PO TABS: 75.0000 mg | ORAL_TABLET | Freq: Every day | ORAL | 0 refills | Status: AC

## 2019-09-29 MED ORDER — ATORVASTATIN CALCIUM 40 MG PO TABS: 40.0000 mg | ORAL_TABLET | Freq: Every day | ORAL | 0 refills | Status: DC

## 2019-09-29 MED ORDER — AMLODIPINE BESYLATE 5 MG PO TABS: 5.0000 mg | ORAL_TABLET | Freq: Every day | ORAL | 0 refills | Status: DC

## 2019-09-29 MED ORDER — CLOPIDOGREL BISULFATE 75 MG PO TABS: 75.0000 mg | ORAL_TABLET | Freq: Every day | ORAL | 0 refills | Status: DC

## 2019-09-29 NOTE — Evaluation (Signed)
Physical Therapy Evaluation and Discharge Patient Details Name: Dylan Fuller MRN: [id number] DOB: 1965-08-19 Today's Date: 09/29/2019   History of Present Illness  Pt is a 54 y/o male admitted secondary to dizziness and R extremity numbness. Found to have acute L pons infarct and subacute R corona radiata infarct. No pertinent PMH.   Clinical Impression  Patient evaluated by Physical Therapy with no further acute PT needs identified. All education has been completed and the patient has no further questions. Pt overall steady when performing gait and dynamic balance tasks. No LOB noted. Pt functioning at a supervision to independent level. Educated about "BE FAST" acronym in recognizing CVA symptoms. See below for any follow-up Physical Therapy or equipment needs. PT is signing off. Thank you for this referral. If needs change, please re-consult.      Follow Up Recommendations No PT follow up    Equipment Recommendations  None recommended by PT    Recommendations for Other Services       Precautions / Restrictions Precautions Precautions: None Restrictions Weight Bearing Restrictions: No      Mobility  Bed Mobility Overal bed mobility: Independent                Transfers Overall transfer level: Independent                  Ambulation/Gait Ambulation/Gait assistance: Supervision Gait Distance (Feet): 200 Feet Assistive device: None Gait Pattern/deviations: WFL(Within Functional Limits) Gait velocity: WFL    General Gait Details: Overall steady gait with good gait speed. Was able to perform DGI tasks without LOB. Supervision for safety and line management.   Stairs Stairs: Yes       General stair comments: Practiced marching X10 to simulate step clearance as pt still in ED and unable to practice steps.   Wheelchair Mobility    Modified Rankin (Stroke Patients Only)       Balance Overall balance assessment: Needs assistance Sitting-balance  support: Feet supported;No upper extremity supported Sitting balance-Leahy Scale: Normal     Standing balance support: No upper extremity supported;During functional activity Standing balance-Leahy Scale: Good                   Standardized Balance Assessment Standardized Balance Assessment : Dynamic Gait Index   Dynamic Gait Index Level Surface: Normal Change in Gait Speed: Normal Gait with Horizontal Head Turns: Normal Gait with Vertical Head Turns: Normal Gait and Pivot Turn: Normal Step Over Obstacle: Normal Step Around Obstacles: Normal       Pertinent Vitals/Pain Pain Assessment: No/denies pain    Home Living Family/patient expects to be discharged to:: Private residence Living Arrangements: Spouse/significant other Available Help at Discharge: Family;Available 24 hours/day Type of Home: House Home Access: Stairs to enter Entrance Stairs-Rails: None Entrance Stairs-Number of Steps: 2 Home Layout: Two level Home Equipment: None      Prior Function Level of Independence: Independent               Hand Dominance        Extremity/Trunk Assessment   Upper Extremity Assessment Upper Extremity Assessment: Overall WFL for tasks assessed    Lower Extremity Assessment Lower Extremity Assessment: Overall WFL for tasks assessed    Cervical / Trunk Assessment Cervical / Trunk Assessment: Normal  Communication   Communication: No difficulties  Cognition Arousal/Alertness: Awake/alert Behavior During Therapy: WFL for tasks assessed/performed Overall Cognitive Status: Within Functional Limits for tasks assessed  General Comments      Exercises     Assessment/Plan    PT Assessment Patent does not need any further PT services  PT Problem List         PT Treatment Interventions      PT Goals (Current goals can be found in the Care Plan section)  Acute Rehab PT Goals Patient Stated  Goal: to go home PT Goal Formulation: With patient Time For Goal Achievement: 09/29/19 Potential to Achieve Goals: Good    Frequency     Barriers to discharge        Co-evaluation               AM-PAC PT "6 Clicks" Mobility  Outcome Measure Help needed turning from your back to your side while in a flat bed without using bedrails?: None Help needed moving from lying on your back to sitting on the side of a flat bed without using bedrails?: None Help needed moving to and from a bed to a chair (including a wheelchair)?: None Help needed standing up from a chair using your arms (e.g., wheelchair or bedside chair)?: None Help needed to walk in hospital room?: None Help needed climbing 3-5 steps with a railing? : None 6 Click Score: 24    End of Session Equipment Utilized During Treatment: Gait belt Activity Tolerance: Patient tolerated treatment well Patient left: in bed;with call bell/phone within reach;with nursing/sitter in room;with family/visitor present Nurse Communication: Mobility status PT Visit Diagnosis: Other abnormalities of gait and mobility (R26.89)    Time: [id number] PT Time Calculation (min) (ACUTE ONLY): 15 min   Charges:   PT Evaluation $PT Eval Low Complexity: 1 Low          Lou Miner, DPT  Acute Rehabilitation Services  Pager: [phone] Office: [phone]   Rudean Hitt 09/29/2019, 12:39 PM

## 2019-09-29 NOTE — ED Notes (Signed)
Tele   bfast ordered  

## 2019-09-29 NOTE — ED Notes (Signed)
Lunch Tray Ordered @ 1047. 

## 2019-09-29 NOTE — ED Notes (Signed)
Neuro paged 

## 2019-09-29 NOTE — ED Notes (Signed)
Pt alert and oriented, eating breakfast without noted changes.  Would like to discuss possible dc with MD.

## 2019-09-29 NOTE — Progress Notes (Addendum)
STROKE TEAM PROGRESS NOTE   INTERVAL HISTORY His wife is at the bedside.  Symptoms have improved. Stroke education and secondary prevention was discussed in detail.  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  I personally reviewed history of presenting illness, electronic medical records and imaging films in PACS.  Vitals:   09/29/19 0700 09/29/19 0900 09/29/19 1000 09/29/19 1100  BP: 120/80  (!) 166/116 (!) 152/102  Pulse: 87 90 98 (!) 104  Resp: 11 (!) 21 (!) 21 18  Temp:      TempSrc:      SpO2: 96% 94% 95% 95%  Weight:      Height:        CBC:  Recent Labs  Lab 09/27/19 2301 09/27/19 2336  WBC 16.3*  --   NEUTROABS 13.3*  --   HGB 15.6 16.0  HCT 46.5 47.0  MCV 87.4  --   PLT 266  --     Basic Metabolic Panel:  Recent Labs  Lab 09/27/19 2301 09/27/19 2336  NA 138 139  K 3.8 3.9  CL 101 101  CO2 24  --   GLUCOSE 139* 130*  BUN 14 17  CREATININE 0.86 0.80  CALCIUM 9.7  --    Lipid Panel:     Component Value Date/Time   CHOL 176 09/29/2019 0627   TRIG 160 (H) 09/29/2019 0627   HDL 30 (L) 09/29/2019 0627   CHOLHDL 5.9 09/29/2019 0627   VLDL 32 09/29/2019 0627   LDLCALC 114 (H) 09/29/2019 0627   HgbA1c:  Lab Results  Component Value Date   HGBA1C 5.3 09/28/2019   Urine Drug Screen:     Component Value Date/Time   LABOPIA NONE DETECTED 09/28/2019 1420   COCAINSCRNUR NONE DETECTED 09/28/2019 1420   LABBENZ NONE DETECTED 09/28/2019 1420   AMPHETMU NONE DETECTED 09/28/2019 1420   THCU NONE DETECTED 09/28/2019 1420   LABBARB NONE DETECTED 09/28/2019 1420    Alcohol Level No results found for: ETH  IMAGING past 24 hours CT ANGIO HEAD W OR WO CONTRAST  Result Date: 09/28/2019 CLINICAL DATA:  54 year old male with small acute infarct in the dorsal left pons and subacute  appearing right corona radiata lacunar infarcts today on MRI. Dizziness, right side numbness and tingling. EXAM: CT ANGIOGRAPHY HEAD AND NECK TECHNIQUE: Multidetector CT imaging of the head and neck was performed using the standard protocol during bolus administration of intravenous contrast. Multiplanar CT image reconstructions and MIPs were obtained to evaluate the vascular anatomy. Carotid stenosis measurements (when applicable) are obtained utilizing NASCET criteria, using the distal internal carotid diameter as the denominator. CONTRAST:  21mL OMNIPAQUE IOHEXOL 350 MG/ML SOLN COMPARISON:  Brain MRI and plain head CT earlier today FINDINGS: CTA NECK Skeleton: Negative. Upper chest: Negative. Other neck: Negative. Aortic arch: 3 vessel arch configuration with no arch atherosclerosis. Tortuous proximal great vessels. Right carotid system: Mildly tortuous proximal right CCA with mild motion artifact. No right carotid bifurcation or cervical right ICA plaque or stenosis. Tortuous right ICA just below the skull base. Left carotid system: Tortuous proximal left CCA. No left carotid bifurcation plaque or stenosis. Tortuous, ectatic left ICA just below the skull base. Vertebral arteries: Minimal plaque in the proximal right subclavian artery without stenosis. Normal right vertebral artery origin. Patent right vertebral artery to  the skull base without plaque or stenosis. Minimal plaque in the proximal left subclavian artery without stenosis. Mild tortuosity. Normal left vertebral artery origin. Mildly tortuous left V1 segment. The left vertebral is slightly non dominant and patent to the skull base without plaque or stenosis. CTA HEAD Posterior circulation: Mildly dominant right vertebral V4 segment. Patent distal vertebral arteries without plaque or stenosis. Patent vertebrobasilar junction and dominant AICA origins. Patent basilar artery without plaque or stenosis. Patent SCA and PCA origins. Posterior communicating  arteries are diminutive or absent. On the left there is mild P1/proximal P2 irregularity and stenosis (series 11, image 22. But the left PCA branches are within normal limits. The right P1 and P2 are within normal limits. But there is severe stenosis of the inferior right P3 division with preserved distal enhancement (series 12, image 20). Anterior circulation: Both ICA siphons are patent. On the left there is mild to moderate calcified plaque, but also ectasia of the left ICA siphon without stenosis. Normal left ophthalmic artery origin. Similar mild to moderate calcified plaque on the right with mild ectasia and no stenosis. Normal right ophthalmic artery origin. Patent carotid termini, MCA and ACA origins. Normal anterior communicating artery. Mildly ectatic proximal A2 segments. ACA branches are within normal limits. Left MCA M1 segment and bifurcation are patent without stenosis. Right MCA M1 segment and bifurcation are patent without stenosis. Bilateral MCA branches are within normal limits. Venous sinuses: Patent. The right transverse and sigmoid sinuses appear dominant. Anatomic variants: Mildly dominant right vertebral artery, right transverse and sigmoid dural venous sinuses. Review of the MIP images confirms the above findings IMPRESSION: 1. Negative for large vessel occlusion and no atherosclerosis in the neck. 2. Positive for intracranial atherosclerosis: - Severe Right PCA P3 segment stenoses. - mild Left PCA P1 stenosis. - bilateral ICA siphon calcified plaque but no significant stenosis. 3. Mildly ectatic ICA siphons and intracranial arteries. Electronically Signed   By: Genevie Ann M.D.   On: 09/28/2019 14:02   CT ANGIO NECK W OR WO CONTRAST  Result Date: 09/28/2019 CLINICAL DATA:  54 year old male with small acute infarct in the dorsal left pons and subacute appearing right corona radiata lacunar infarcts today on MRI. Dizziness, right side numbness and tingling. EXAM: CT ANGIOGRAPHY HEAD AND NECK  TECHNIQUE: Multidetector CT imaging of the head and neck was performed using the standard protocol during bolus administration of intravenous contrast. Multiplanar CT image reconstructions and MIPs were obtained to evaluate the vascular anatomy. Carotid stenosis measurements (when applicable) are obtained utilizing NASCET criteria, using the distal internal carotid diameter as the denominator. CONTRAST:  52mL OMNIPAQUE IOHEXOL 350 MG/ML SOLN COMPARISON:  Brain MRI and plain head CT earlier today FINDINGS: CTA NECK Skeleton: Negative. Upper chest: Negative. Other neck: Negative. Aortic arch: 3 vessel arch configuration with no arch atherosclerosis. Tortuous proximal great vessels. Right carotid system: Mildly tortuous proximal right CCA with mild motion artifact. No right carotid bifurcation or cervical right ICA plaque or stenosis. Tortuous right ICA just below the skull base. Left carotid system: Tortuous proximal left CCA. No left carotid bifurcation plaque or stenosis. Tortuous, ectatic left ICA just below the skull base. Vertebral arteries: Minimal plaque in the proximal right subclavian artery without stenosis. Normal right vertebral artery origin. Patent right vertebral artery to the skull base without plaque or stenosis. Minimal plaque in the proximal left subclavian artery without stenosis. Mild tortuosity. Normal left vertebral artery origin. Mildly tortuous left V1 segment. The left vertebral is slightly non dominant  and patent to the skull base without plaque or stenosis. CTA HEAD Posterior circulation: Mildly dominant right vertebral V4 segment. Patent distal vertebral arteries without plaque or stenosis. Patent vertebrobasilar junction and dominant AICA origins. Patent basilar artery without plaque or stenosis. Patent SCA and PCA origins. Posterior communicating arteries are diminutive or absent. On the left there is mild P1/proximal P2 irregularity and stenosis (series 11, image 22. But the left PCA  branches are within normal limits. The right P1 and P2 are within normal limits. But there is severe stenosis of the inferior right P3 division with preserved distal enhancement (series 12, image 20). Anterior circulation: Both ICA siphons are patent. On the left there is mild to moderate calcified plaque, but also ectasia of the left ICA siphon without stenosis. Normal left ophthalmic artery origin. Similar mild to moderate calcified plaque on the right with mild ectasia and no stenosis. Normal right ophthalmic artery origin. Patent carotid termini, MCA and ACA origins. Normal anterior communicating artery. Mildly ectatic proximal A2 segments. ACA branches are within normal limits. Left MCA M1 segment and bifurcation are patent without stenosis. Right MCA M1 segment and bifurcation are patent without stenosis. Bilateral MCA branches are within normal limits. Venous sinuses: Patent. The right transverse and sigmoid sinuses appear dominant. Anatomic variants: Mildly dominant right vertebral artery, right transverse and sigmoid dural venous sinuses. Review of the MIP images confirms the above findings IMPRESSION: 1. Negative for large vessel occlusion and no atherosclerosis in the neck. 2. Positive for intracranial atherosclerosis: - Severe Right PCA P3 segment stenoses. - mild Left PCA P1 stenosis. - bilateral ICA siphon calcified plaque but no significant stenosis. 3. Mildly ectatic ICA siphons and intracranial arteries. Electronically Signed   By: Genevie Ann M.D.   On: 09/28/2019 14:02   ECHOCARDIOGRAM COMPLETE  Result Date: 09/28/2019    ECHOCARDIOGRAM REPORT   Patient Name:   Dylan Fuller Date of Exam: 09/28/2019 Medical Rec #:  [id number]       Height:       72.0 in Accession #:    [id number]      Weight:       220.0 lb Date of Birth:  26-Nov-1965       BSA:          2.219 m Patient Age:    10 years        BP:           141/86 mmHg Patient Gender: M               HR:           78 bpm. Exam Location:  Inpatient  Procedure: 2D Echo Indications:    Stroke 434.91 / I163.9  History:        Patient has no prior history of Echocardiogram examinations. No                 prior cardiac history.  Sonographer:    Vikki Ports Turrentine Referring Phys: [place]  1. Left ventricular ejection fraction, by estimation, is 60 to 65%. The left ventricle has normal function. The left ventricle has no regional wall motion abnormalities. There is mild concentric left ventricular hypertrophy. Left ventricular diastolic parameters were normal.  2. Right ventricular systolic function is normal. The right ventricular size is normal.  3. The mitral valve is normal in structure. Trivial mitral valve regurgitation. No evidence of mitral stenosis.  4. The aortic valve is normal in structure. Aortic valve  regurgitation is not visualized. No aortic stenosis is present.  5. The inferior vena cava is normal in size with greater than 50% respiratory variability, suggesting right atrial pressure of 3 mmHg. Conclusion(s)/Recommendation(s): No intracardiac source of embolism detected on this transthoracic study. A transesophageal echocardiogram is recommended to exclude cardiac source of embolism if clinically indicated. FINDINGS  Left Ventricle: Left ventricular ejection fraction, by estimation, is 60 to 65%. The left ventricle has normal function. The left ventricle has no regional wall motion abnormalities. The left ventricular internal cavity size was normal in size. There is  mild concentric left ventricular hypertrophy. Left ventricular diastolic parameters were normal. Normal left ventricular filling pressure. Right Ventricle: The right ventricular size is normal. No increase in right ventricular wall thickness. Right ventricular systolic function is normal. Left Atrium: Left atrial size was normal in size. Right Atrium: Right atrial size was normal in size. Pericardium: There is no evidence of pericardial effusion. Mitral Valve: The  mitral valve is normal in structure. Normal mobility of the mitral valve leaflets. Trivial mitral valve regurgitation. No evidence of mitral valve stenosis. Tricuspid Valve: The tricuspid valve is normal in structure. Tricuspid valve regurgitation is trivial. No evidence of tricuspid stenosis. Aortic Valve: The aortic valve is normal in structure. Aortic valve regurgitation is not visualized. No aortic stenosis is present. Aortic valve mean gradient measures 2.0 mmHg. Aortic valve peak gradient measures 4.3 mmHg. Aortic valve area, by VTI measures 4.54 cm. Pulmonic Valve: The pulmonic valve was normal in structure. Pulmonic valve regurgitation is not visualized. No evidence of pulmonic stenosis. Aorta: The aortic root is normal in size and structure. Venous: The inferior vena cava is normal in size with greater than 50% respiratory variability, suggesting right atrial pressure of 3 mmHg. IAS/Shunts: No atrial level shunt detected by color flow Doppler.  LEFT VENTRICLE PLAX 2D LVIDd:         4.30 cm  Diastology LVIDs:         2.90 cm  LV e' lateral:   10.30 cm/s LV PW:         1.10 cm  LV E/e' lateral: 7.3 LV IVS:        1.10 cm  LV e' medial:    7.94 cm/s LVOT diam:     2.60 cm  LV E/e' medial:  9.5 LV SV:         88 LV SV Index:   40 LVOT Area:     5.31 cm  RIGHT VENTRICLE RV S prime:     13.30 cm/s TAPSE (M-mode): 1.4 cm LEFT ATRIUM             Index       RIGHT ATRIUM           Index LA diam:        3.80 cm 1.71 cm/m  RA Area:     14.70 cm LA Vol (A2C):   75.0 ml 33.80 ml/m RA Volume:   35.80 ml  16.14 ml/m LA Vol (A4C):   43.6 ml 19.65 ml/m LA Biplane Vol: 58.5 ml 26.37 ml/m  AORTIC VALVE AV Area (Vmax):    3.77 cm AV Area (Vmean):   4.18 cm AV Area (VTI):     4.54 cm AV Vmax:           104.00 cm/s AV Vmean:          72.000 cm/s AV VTI:            0.194 m  AV Peak Grad:      4.3 mmHg AV Mean Grad:      2.0 mmHg LVOT Vmax:         73.80 cm/s LVOT Vmean:        56.700 cm/s LVOT VTI:          0.166 m  LVOT/AV VTI ratio: 0.86  AORTA Ao Root diam: 4.30 cm Ao Asc diam:  3.70 cm MITRAL VALVE MV Area (PHT): 3.63 cm    SHUNTS MV Decel Time: 209 msec    Systemic VTI:  0.17 m MV E velocity: 75.40 cm/s  Systemic Diam: 2.60 cm MV A velocity: 66.40 cm/s MV E/A ratio:  1.14 Ena Dawley MD Electronically signed by Ena Dawley MD Signature Date/Time: 09/28/2019/11:11:45 PM    Final     PHYSICAL EXAM Constitutional: Appears well-developed and well-nourished.  Obese middle-aged Caucasian male Psych: Affect appropriate to situation Eyes: No scleral injection HENT: No OP obstrucion MSK: no joint deformities.  Cardiovascular: Normal rate and regular rhythm.  Respiratory: Effort normal, non-labored breathing GI: Soft.  No distension. There is no tenderness.  Skin: WDI  Neuro: Mental Status: Patient is awake, alert, oriented to person, place, month, year, and situation. Patient is able to give a clear and coherent history. No signs of aphasia or neglect Cranial Nerves: II: Visual Fields are full. Pupils are equal, round, and reactive to light.   III,IV, VI: EOMI without ptosis or diploplia.  V: Facial sensation is symmetric to temperature VII: Facial movement with mildly decreased nasolabial fold on the right VIII: hearing is intact to voice X: Uvula elevates symmetrically XI: Shoulder shrug is symmetric. XII: tongue is midline without atrophy or fasciculations.  Motor: Tone is normal. Bulk is normal.  4/5 strength in the right arm and leg Sensory: Sensation is diminished in the right  Cerebellar: FNF intact on the left   ASSESSMENT/PLAN Dylan Fuller is a 54 y.o. male with alteration in balance and dizziness. No prior knowledge of stroke, but evidence seen of prev lacunars.  Stroke: Left pontine and right corona radiata lacunar infarcts from small vessel disease    CT head No acute abnormality. Chronic lacunar infarcts in rt BG/parietal area  CTA head No lvo. Severe Rt P3 &  P1 stenosis  CTA neck ICA siphons and ICA's  MRI  Small lacunar stroke in left pons; subacute right CR/post lentiform stroke  2D Echo  LDL 114  HgbA1c 5.3  Lovenox for VTE prophylaxis    Diet   Diet heart healthy/carb modified Room service appropriate? Yes; Fluid consistency: Thin     none prior to admission, now on ASA 81mg  + Plavix 75mg  x3 weeks, then just ASA alone  Therapy recommendations:  home  Disposition:  home  New dx of Hypertension . Long-term BP goal normotensive . Needs to be started on BP med at time of dc and have PCP f/u  Hyperlipidemia  Home meds: none  LDL 114, goal < 70  Add Lipitor 40mg  PO daily  Continue statin at discharge  No dx of Diabetes  HgbA1c 5.3, goal < 7.0  CBGs Recent Labs    09/29/19 0308 09/29/19 0522 09/29/19 0729  GLUCAP 92 101* 88      Other Stroke Risk Factors  none  Hospital day # 1  Desiree Metzger-Cihelka, ARNP-C, ANVP-BC Pager: [phone]  I have personally obtained history,examined this patient, reviewed notes, independently viewed imaging studies, participated in medical decision making and plan of care.ROS completed by  me personally and pertinent positives fully documented  I have made any additions or clarifications directly to the above note. Agree with note above.  He presented with gait difficulties and left-sided paresthesias secondary to pontine and right corona radiata lacunar infarct from small vessel disease.  Recommend aspirin Plavix for 3 weeks followed by aspirin alone and aggressive risk factor modification.  Start statin.  Follow-up as an outpatient stroke clinic in 6 weeks.  Discussed with Dr. Bonner Puna.  Greater than 50% time during this 25-minute visit was spent on counseling and coordination of care and answering questions.  Discussed with patient and wife.  Antony Contras, MD Medical Director [facility] Stroke Center Pager: [phone] 09/29/2019 6:02 PM   To contact Stroke Continuity  provider, please refer to [email or web address] After hours, contact General Neurology

## 2019-09-29 NOTE — Discharge Summary (Signed)
Physician Discharge Summary  Dylan Fuller [id number] DOB: [birth year] DOA: 09/27/2019  PCP: Marletta Lor, MD  Admit date: 09/27/2019 Discharge date: 09/29/2019  Admitted From: Home Disposition: Home   Recommendations for Outpatient Follow-up:  1. Follow up with PCP in 1-2 weeks 2. Monitor BP, started norvasc 5mg  3. Follow up with neurology, Dr. Leonie Man in 6-8 weeks.   Home Health: None Equipment/Devices: None Discharge Condition: Stable CODE STATUS: Full Diet recommendation: Heart healthy  Brief/Interim Summary: Dylan Fuller a 54 y.o.malept is a 54 year old male who recently reports stroke like symptoms. Patient was at work when he started feeling dizzy and his balance was altered. He was able to drive home however patient continued to experience increase numbness on his right side radiating down to his right arms and legs, with elevated blood pressure. He was transported to the ED by family when symptoms did not resolve. Neurology was consulted. CT with evidence of old chronic lacunar infarcts in rt BG/parietal area, and MRI confirmed left pontine and right corona radiata lacunar infarcts from small vessel disease    Discharge Diagnoses:  Principal Problem:   CVA (cerebral vascular accident) ([place]) - Continue DAPT x3 weeks, then continue on aspirin 81mg  alone - Continue risk factor control, added lipitor 40mg  and norvasc 5mg   Discharge Instructions Discharge Instructions    Ambulatory referral to Neurology   Complete by: As directed    An appointment is requested in approximately: 8 weeks   Diet - low sodium heart healthy   Complete by: As directed    Discharge instructions   Complete by: As directed    Start taking aspirin 81mg  and plavix 75mg  daily to reduce risk of stroke. After 3 weeks of dual antiplatelet therapy, continue taking only aspirin alone once daily. Start taking atorvastatin 40mg  daily and norvasc 5mg  daily as well to control risk factors for  recurrent stroke. IF your symptoms return, seek medical attention right away, otherwise follow up with your primary doctor in 2 weeks and with neurology in 6-8 weeks. If you don't hear from the neurology office in the next week, please call to confirm appointment.   Increase activity slowly   Complete by: As directed      Allergies as of 09/29/2019   No Known Allergies     Medication List    TAKE these medications   amLODipine 5 MG tablet Commonly known as: NORVASC Take 1 tablet (5 mg total) by mouth daily.   aspirin EC 81 MG tablet Take 1 tablet (81 mg total) by mouth daily.   atorvastatin 40 MG tablet Commonly known as: LIPITOR Take 1 tablet (40 mg total) by mouth daily.   clopidogrel 75 MG tablet Commonly known as: Plavix Take 1 tablet (75 mg total) by mouth daily for 21 days.      Follow-up Information    Marletta Lor, MD. Schedule an appointment as soon as possible for a visit in 2 week(s).   Specialty: Internal Medicine Contact information: [place] [address] [phone]        Garvin Fila, MD. Schedule an appointment as soon as possible for a visit in 2 month(s).   Specialties: Neurology, Radiology Contact information: [address] [place] [address] [phone]          No Known Allergies  Consultations:  Neurology  Procedures/Studies: CT ANGIO HEAD W OR WO CONTRAST  Result Date: 09/28/2019 CLINICAL DATA:  54 year old male with small acute  infarct in the dorsal left pons and subacute appearing right corona radiata lacunar infarcts today on MRI. Dizziness, right side numbness and tingling. EXAM: CT ANGIOGRAPHY HEAD AND NECK TECHNIQUE: Multidetector CT imaging of the head and neck was performed using the standard protocol during bolus administration of intravenous contrast. Multiplanar CT image reconstructions and MIPs were obtained to evaluate the vascular anatomy. Carotid stenosis measurements  (when applicable) are obtained utilizing NASCET criteria, using the distal internal carotid diameter as the denominator. CONTRAST:  71mL OMNIPAQUE IOHEXOL 350 MG/ML SOLN COMPARISON:  Brain MRI and plain head CT earlier today FINDINGS: CTA NECK Skeleton: Negative. Upper chest: Negative. Other neck: Negative. Aortic arch: 3 vessel arch configuration with no arch atherosclerosis. Tortuous proximal great vessels. Right carotid system: Mildly tortuous proximal right CCA with mild motion artifact. No right carotid bifurcation or cervical right ICA plaque or stenosis. Tortuous right ICA just below the skull base. Left carotid system: Tortuous proximal left CCA. No left carotid bifurcation plaque or stenosis. Tortuous, ectatic left ICA just below the skull base. Vertebral arteries: Minimal plaque in the proximal right subclavian artery without stenosis. Normal right vertebral artery origin. Patent right vertebral artery to the skull base without plaque or stenosis. Minimal plaque in the proximal left subclavian artery without stenosis. Mild tortuosity. Normal left vertebral artery origin. Mildly tortuous left V1 segment. The left vertebral is slightly non dominant and patent to the skull base without plaque or stenosis. CTA HEAD Posterior circulation: Mildly dominant right vertebral V4 segment. Patent distal vertebral arteries without plaque or stenosis. Patent vertebrobasilar junction and dominant AICA origins. Patent basilar artery without plaque or stenosis. Patent SCA and PCA origins. Posterior communicating arteries are diminutive or absent. On the left there is mild P1/proximal P2 irregularity and stenosis (series 11, image 22. But the left PCA branches are within normal limits. The right P1 and P2 are within normal limits. But there is severe stenosis of the inferior right P3 division with preserved distal enhancement (series 12, image 20). Anterior circulation: Both ICA siphons are patent. On the left there is mild  to moderate calcified plaque, but also ectasia of the left ICA siphon without stenosis. Normal left ophthalmic artery origin. Similar mild to moderate calcified plaque on the right with mild ectasia and no stenosis. Normal right ophthalmic artery origin. Patent carotid termini, MCA and ACA origins. Normal anterior communicating artery. Mildly ectatic proximal A2 segments. ACA branches are within normal limits. Left MCA M1 segment and bifurcation are patent without stenosis. Right MCA M1 segment and bifurcation are patent without stenosis. Bilateral MCA branches are within normal limits. Venous sinuses: Patent. The right transverse and sigmoid sinuses appear dominant. Anatomic variants: Mildly dominant right vertebral artery, right transverse and sigmoid dural venous sinuses. Review of the MIP images confirms the above findings IMPRESSION: 1. Negative for large vessel occlusion and no atherosclerosis in the neck. 2. Positive for intracranial atherosclerosis: - Severe Right PCA P3 segment stenoses. - mild Left PCA P1 stenosis. - bilateral ICA siphon calcified plaque but no significant stenosis. 3. Mildly ectatic ICA siphons and intracranial arteries. Electronically Signed   By: Genevie Ann M.D.   On: 09/28/2019 14:02   CT HEAD WO CONTRAST  Result Date: 09/28/2019 CLINICAL DATA:  Right-sided numbness and tingling in face and right upper extremity, dizziness EXAM: CT HEAD WITHOUT CONTRAST TECHNIQUE: Contiguous axial images were obtained from the base of the skull through the vertex without intravenous contrast. COMPARISON:  02/23/2004 FINDINGS: Brain: Focal hypodensities in the right  basal ganglia and right parietal periventricular white matter consistent with chronic lacunar infarcts. Otherwise no acute infarct or hemorrhage. Lateral ventricles and remaining midline structures are unremarkable. No acute extra-axial fluid collections. No mass effect. Vascular: No hyperdense vessel or unexpected calcification. Skull:  Normal. Negative for fracture or focal lesion. Sinuses/Orbits: Mild mucoperiosteal thickening within the ethmoid air cells. Remaining paranasal sinuses are clear. Other: None IMPRESSION: 1. Chronic lacunar infarcts right basal ganglia and parietal periventricular white matter. 2. No acute intracranial process. Electronically Signed   By: Randa Ngo M.D.   On: 09/28/2019 00:27   CT ANGIO NECK W OR WO CONTRAST  Result Date: 09/28/2019 CLINICAL DATA:  54 year old male with small acute infarct in the dorsal left pons and subacute appearing right corona radiata lacunar infarcts today on MRI. Dizziness, right side numbness and tingling. EXAM: CT ANGIOGRAPHY HEAD AND NECK TECHNIQUE: Multidetector CT imaging of the head and neck was performed using the standard protocol during bolus administration of intravenous contrast. Multiplanar CT image reconstructions and MIPs were obtained to evaluate the vascular anatomy. Carotid stenosis measurements (when applicable) are obtained utilizing NASCET criteria, using the distal internal carotid diameter as the denominator. CONTRAST:  59mL OMNIPAQUE IOHEXOL 350 MG/ML SOLN COMPARISON:  Brain MRI and plain head CT earlier today FINDINGS: CTA NECK Skeleton: Negative. Upper chest: Negative. Other neck: Negative. Aortic arch: 3 vessel arch configuration with no arch atherosclerosis. Tortuous proximal great vessels. Right carotid system: Mildly tortuous proximal right CCA with mild motion artifact. No right carotid bifurcation or cervical right ICA plaque or stenosis. Tortuous right ICA just below the skull base. Left carotid system: Tortuous proximal left CCA. No left carotid bifurcation plaque or stenosis. Tortuous, ectatic left ICA just below the skull base. Vertebral arteries: Minimal plaque in the proximal right subclavian artery without stenosis. Normal right vertebral artery origin. Patent right vertebral artery to the skull base without plaque or stenosis. Minimal plaque in  the proximal left subclavian artery without stenosis. Mild tortuosity. Normal left vertebral artery origin. Mildly tortuous left V1 segment. The left vertebral is slightly non dominant and patent to the skull base without plaque or stenosis. CTA HEAD Posterior circulation: Mildly dominant right vertebral V4 segment. Patent distal vertebral arteries without plaque or stenosis. Patent vertebrobasilar junction and dominant AICA origins. Patent basilar artery without plaque or stenosis. Patent SCA and PCA origins. Posterior communicating arteries are diminutive or absent. On the left there is mild P1/proximal P2 irregularity and stenosis (series 11, image 22. But the left PCA branches are within normal limits. The right P1 and P2 are within normal limits. But there is severe stenosis of the inferior right P3 division with preserved distal enhancement (series 12, image 20). Anterior circulation: Both ICA siphons are patent. On the left there is mild to moderate calcified plaque, but also ectasia of the left ICA siphon without stenosis. Normal left ophthalmic artery origin. Similar mild to moderate calcified plaque on the right with mild ectasia and no stenosis. Normal right ophthalmic artery origin. Patent carotid termini, MCA and ACA origins. Normal anterior communicating artery. Mildly ectatic proximal A2 segments. ACA branches are within normal limits. Left MCA M1 segment and bifurcation are patent without stenosis. Right MCA M1 segment and bifurcation are patent without stenosis. Bilateral MCA branches are within normal limits. Venous sinuses: Patent. The right transverse and sigmoid sinuses appear dominant. Anatomic variants: Mildly dominant right vertebral artery, right transverse and sigmoid dural venous sinuses. Review of the MIP images confirms the above findings IMPRESSION:  1. Negative for large vessel occlusion and no atherosclerosis in the neck. 2. Positive for intracranial atherosclerosis: - Severe Right PCA  P3 segment stenoses. - mild Left PCA P1 stenosis. - bilateral ICA siphon calcified plaque but no significant stenosis. 3. Mildly ectatic ICA siphons and intracranial arteries. Electronically Signed   By: Genevie Ann M.D.   On: 09/28/2019 14:02   MR BRAIN WO CONTRAST  Result Date: 09/28/2019 CLINICAL DATA:  54 year old male with right side numbness and tingling in the face and upper extremity. Dizziness. EXAM: MRI HEAD WITHOUT CONTRAST TECHNIQUE: Multiplanar, multiecho pulse sequences of the brain and surrounding structures were obtained without intravenous contrast. COMPARISON:  Head CT earlier today. FINDINGS: Brain: There is a small 7 mm oval/linear focus of abnormal trace diffusion tracking toward the posterior right lentiform nuclei from the posterior right corona radiata (series 5, image 81 and series 7, image 48). This appears isointense on ADC, with associated well developed T2/FLAIR hyperintensity and some T1 hypointensity. No acute hemorrhage or mass effect. Additionally there is a 6 mm linear focus of restricted diffusion in the dorsal left pons on series 5, image 65 and series 7, image 45. This has faint T2 and FLAIR hyperintensity, with no hemorrhage or mass effect. No other restricted diffusion, but there are fairly numerous areas of chronic lacunar infarct in the bilateral basal ganglia (series 10, image 15) scattered in the bilateral cerebral white matter, and associated with fairly numerous chronic microhemorrhages - scattered throughout the brain but clustered in the deep gray nuclei and also the left occipital lobe. No superimposed cortical encephalomalacia identified. Aside from the acute finding on the left the brainstem is spared. The cerebellum appears normal. No midline shift, mass effect, evidence of mass lesion, ventriculomegaly, extra-axial collection or acute intracranial hemorrhage. Cervicomedullary junction and pituitary are within normal limits. Vascular: Major intracranial vascular  flow voids are preserved. Skull and upper cervical spine: Negative visible cervical spine, bone marrow signal. Sinuses/Orbits: Negative orbits. Mild paranasal sinus mucosal thickening. Other: Mastoids are clear. Visible internal auditory structures appear normal. Scalp and face soft tissues appear negative. IMPRESSION: 1. Small acute lacunar infarct in the dorsal left pons with no hemorrhage or mass effect. 2. Subacute lacunar infarct in the right corona radiata/posterior lentiform, with no hemorrhage or mass effect. 3. Underlying advanced chronic small vessel disease including fairly numerous chronic microhemorrhages scattered in the brain. Electronically Signed   By: Genevie Ann M.D.   On: 09/28/2019 08:57   ECHOCARDIOGRAM COMPLETE  Result Date: 09/28/2019    ECHOCARDIOGRAM REPORT   Patient Name:   Dylan Fuller Date of Exam: 09/28/2019 Medical Rec #:  [id number]       Height:       72.0 in Accession #:    [id number]      Weight:       220.0 lb Date of Birth:  1965/10/25       BSA:          2.219 m Patient Age:    47 years        BP:           141/86 mmHg Patient Gender: M               HR:           78 bpm. Exam Location:  Inpatient Procedure: 2D Echo Indications:    Stroke 434.91 / I163.9  History:        Patient has no prior history of Echocardiogram  examinations. No                 prior cardiac history.  Sonographer:    Vikki Ports Turrentine Referring Phys: [place]  1. Left ventricular ejection fraction, by estimation, is 60 to 65%. The left ventricle has normal function. The left ventricle has no regional wall motion abnormalities. There is mild concentric left ventricular hypertrophy. Left ventricular diastolic parameters were normal.  2. Right ventricular systolic function is normal. The right ventricular size is normal.  3. The mitral valve is normal in structure. Trivial mitral valve regurgitation. No evidence of mitral stenosis.  4. The aortic valve is normal in structure. Aortic  valve regurgitation is not visualized. No aortic stenosis is present.  5. The inferior vena cava is normal in size with greater than 50% respiratory variability, suggesting right atrial pressure of 3 mmHg. Conclusion(s)/Recommendation(s): No intracardiac source of embolism detected on this transthoracic study. A transesophageal echocardiogram is recommended to exclude cardiac source of embolism if clinically indicated. FINDINGS  Left Ventricle: Left ventricular ejection fraction, by estimation, is 60 to 65%. The left ventricle has normal function. The left ventricle has no regional wall motion abnormalities. The left ventricular internal cavity size was normal in size. There is  mild concentric left ventricular hypertrophy. Left ventricular diastolic parameters were normal. Normal left ventricular filling pressure. Right Ventricle: The right ventricular size is normal. No increase in right ventricular wall thickness. Right ventricular systolic function is normal. Left Atrium: Left atrial size was normal in size. Right Atrium: Right atrial size was normal in size. Pericardium: There is no evidence of pericardial effusion. Mitral Valve: The mitral valve is normal in structure. Normal mobility of the mitral valve leaflets. Trivial mitral valve regurgitation. No evidence of mitral valve stenosis. Tricuspid Valve: The tricuspid valve is normal in structure. Tricuspid valve regurgitation is trivial. No evidence of tricuspid stenosis. Aortic Valve: The aortic valve is normal in structure. Aortic valve regurgitation is not visualized. No aortic stenosis is present. Aortic valve mean gradient measures 2.0 mmHg. Aortic valve peak gradient measures 4.3 mmHg. Aortic valve area, by VTI measures 4.54 cm. Pulmonic Valve: The pulmonic valve was normal in structure. Pulmonic valve regurgitation is not visualized. No evidence of pulmonic stenosis. Aorta: The aortic root is normal in size and structure. Venous: The inferior vena cava  is normal in size with greater than 50% respiratory variability, suggesting right atrial pressure of 3 mmHg. IAS/Shunts: No atrial level shunt detected by color flow Doppler.  LEFT VENTRICLE PLAX 2D LVIDd:         4.30 cm  Diastology LVIDs:         2.90 cm  LV e' lateral:   10.30 cm/s LV PW:         1.10 cm  LV E/e' lateral: 7.3 LV IVS:        1.10 cm  LV e' medial:    7.94 cm/s LVOT diam:     2.60 cm  LV E/e' medial:  9.5 LV SV:         88 LV SV Index:   40 LVOT Area:     5.31 cm  RIGHT VENTRICLE RV S prime:     13.30 cm/s TAPSE (M-mode): 1.4 cm LEFT ATRIUM             Index       RIGHT ATRIUM           Index LA diam:  3.80 cm 1.71 cm/m  RA Area:     14.70 cm LA Vol (A2C):   75.0 ml 33.80 ml/m RA Volume:   35.80 ml  16.14 ml/m LA Vol (A4C):   43.6 ml 19.65 ml/m LA Biplane Vol: 58.5 ml 26.37 ml/m  AORTIC VALVE AV Area (Vmax):    3.77 cm AV Area (Vmean):   4.18 cm AV Area (VTI):     4.54 cm AV Vmax:           104.00 cm/s AV Vmean:          72.000 cm/s AV VTI:            0.194 m AV Peak Grad:      4.3 mmHg AV Mean Grad:      2.0 mmHg LVOT Vmax:         73.80 cm/s LVOT Vmean:        56.700 cm/s LVOT VTI:          0.166 m LVOT/AV VTI ratio: 0.86  AORTA Ao Root diam: 4.30 cm Ao Asc diam:  3.70 cm MITRAL VALVE MV Area (PHT): 3.63 cm    SHUNTS MV Decel Time: 209 msec    Systemic VTI:  0.17 m MV E velocity: 75.40 cm/s  Systemic Diam: 2.60 cm MV A velocity: 66.40 cm/s MV E/A ratio:  1.14 Ena Dawley MD Electronically signed by Ena Dawley MD Signature Date/Time: 09/28/2019/11:11:45 PM    Final       Subjective: Symptoms seem to have improved considerably, wants to go home.   Discharge Exam: Vitals:   09/29/19 1000 09/29/19 1100  BP: (!) 166/116 (!) 152/102  Pulse: 98 (!) 104  Resp: (!) 21 18  Temp:    SpO2: 95% 95%   General: Pt is alert, awake, not in acute distress Cardiovascular: RRR, S1/S2 +, no rubs, no gallops Respiratory: CTA bilaterally, no wheezing, no rhonchi Abdominal:  Soft, NT, ND, bowel sounds + Extremities: No edema, no cyanosis Neuro: Alert, oriented, conversant without aphasia. 4+/5 in R extremities  Labs: BNP (last 3 results) No results for input(s): BNP in the last 8760 hours. Basic Metabolic Panel: Recent Labs  Lab 09/27/19 2301 09/27/19 2336  NA 138 139  K 3.8 3.9  CL 101 101  CO2 24  --   GLUCOSE 139* 130*  BUN 14 17  CREATININE 0.86 0.80  CALCIUM 9.7  --    Liver Function Tests: Recent Labs  Lab 09/27/19 2301  AST 26  ALT 48*  ALKPHOS 74  BILITOT 0.9  PROT 7.2  ALBUMIN 4.1   No results for input(s): LIPASE, AMYLASE in the last 168 hours. No results for input(s): AMMONIA in the last 168 hours. CBC: Recent Labs  Lab 09/27/19 2301 09/27/19 2336  WBC 16.3*  --   NEUTROABS 13.3*  --   HGB 15.6 16.0  HCT 46.5 47.0  MCV 87.4  --   PLT 266  --    Cardiac Enzymes: No results for input(s): CKTOTAL, CKMB, CKMBINDEX, TROPONINI in the last 168 hours. BNP: Invalid input(s): POCBNP CBG: Recent Labs  Lab 09/28/19 1212 09/28/19 1721 09/29/19 0308 09/29/19 0522 09/29/19 0729  GLUCAP 90 79 92 101* 88   D-Dimer No results for input(s): DDIMER in the last 72 hours. Hgb A1c Recent Labs    09/28/19 1430  HGBA1C 5.3   Lipid Profile Recent Labs    09/29/19 0627  CHOL 176  HDL 30*  LDLCALC 114*  TRIG 160*  CHOLHDL  5.9   Thyroid function studies Recent Labs    09/28/19 1430  TSH 0.861   Anemia work up No results for input(s): VITAMINB12, FOLATE, FERRITIN, TIBC, IRON, RETICCTPCT in the last 72 hours. Urinalysis    Component Value Date/Time   BILIRUBINUR 1+ 10/23/2015 1115   PROTEINUR 1+ 10/23/2015 1115   UROBILINOGEN 1.0 10/23/2015 1115   NITRITE n 10/23/2015 1115   LEUKOCYTESUR Negative 10/23/2015 1115    Microbiology Recent Results (from the past 240 hour(s))  SARS CORONAVIRUS 2 (TAT 6-24 HRS) Nasopharyngeal Nasopharyngeal Swab     Status: None   Collection Time: 09/28/19 12:44 PM   Specimen:  Nasopharyngeal Swab  Result Value Ref Range Status   SARS Coronavirus 2 NEGATIVE NEGATIVE Final    Comment: (NOTE) SARS-CoV-2 target nucleic acids are NOT DETECTED. The SARS-CoV-2 RNA is generally detectable in upper and lower respiratory specimens during the acute phase of infection. Negative results do not preclude SARS-CoV-2 infection, do not rule out co-infections with other pathogens, and should not be used as the sole basis for treatment or other patient management decisions. Negative results must be combined with clinical observations, patient history, and epidemiological information. The expected result is Negative. Fact Sheet for Patients: SugarRoll.be Fact Sheet for Healthcare Providers: https://www.woods-mathews.com/ This test is not yet approved or cleared by the [place] FDA and  has been authorized for detection and/or diagnosis of SARS-CoV-2 by FDA under an Emergency Use Authorization (EUA). This EUA will remain  in effect (meaning this test can be used) for the duration of the COVID-19 declaration under Section 56 4(b)(1) of the Act, 21 U.S.C. section 360bbb-3(b)(1), unless the authorization is terminated or revoked sooner. Performed at [facility], [place] [address], [place], [place] [address]     Time coordinating discharge: Approximately 40 minutes  Patrecia Pour, MD  [facility] 09/29/2019, 12:14 PM

## 2019-10-04 ENCOUNTER — Telehealth: Payer: Self-pay | Admitting: Surgery

## 2019-10-04 NOTE — Telephone Encounter (Signed)
Patient called concerning a work note, patient  was discharged by Dr. Bonner Puna Hospitalist 4/16 and employer is now requesting a work note. CM sent secured chat to Dr. Bonner Puna.  Awaiting response CM will also attach daytime CM to follow up with this matter.

## 2019-10-05 ENCOUNTER — Encounter (HOSPITAL_COMMUNITY): Payer: Self-pay | Admitting: Emergency Medicine

## 2019-10-05 ENCOUNTER — Telehealth: Payer: Self-pay | Admitting: *Deleted

## 2019-10-05 NOTE — Telephone Encounter (Signed)
Pt called last night requesting a return to work note.  RNCM contacted EDP to provide said note.  RNCM returned call to pt letting him know that note was ready for pickup in ED lobby.

## 2019-10-10 ENCOUNTER — Other Ambulatory Visit: Payer: Self-pay

## 2019-10-11 ENCOUNTER — Ambulatory Visit (INDEPENDENT_AMBULATORY_CARE_PROVIDER_SITE_OTHER): Payer: Managed Care, Other (non HMO) | Admitting: Internal Medicine

## 2019-10-11 ENCOUNTER — Encounter: Payer: Self-pay | Admitting: Internal Medicine

## 2019-10-11 VITALS — BP 130/90 | HR 84 | Temp 98.2°F | Ht 72.0 in | Wt 235.9 lb

## 2019-10-11 DIAGNOSIS — I10 Essential (primary) hypertension: Secondary | ICD-10-CM | POA: Insufficient documentation

## 2019-10-11 DIAGNOSIS — Z09 Encounter for follow-up examination after completed treatment for conditions other than malignant neoplasm: Secondary | ICD-10-CM

## 2019-10-11 DIAGNOSIS — E669 Obesity, unspecified: Secondary | ICD-10-CM

## 2019-10-11 DIAGNOSIS — I639 Cerebral infarction, unspecified: Secondary | ICD-10-CM

## 2019-10-11 DIAGNOSIS — E785 Hyperlipidemia, unspecified: Secondary | ICD-10-CM | POA: Insufficient documentation

## 2019-10-11 DIAGNOSIS — Z86010 Personal history of colonic polyps: Secondary | ICD-10-CM

## 2019-10-11 MED ORDER — AMLODIPINE BESYLATE 5 MG PO TABS: 5.0000 mg | ORAL_TABLET | Freq: Every day | ORAL | 1 refills | Status: DC

## 2019-10-11 MED ORDER — ATORVASTATIN CALCIUM 40 MG PO TABS: 40.0000 mg | ORAL_TABLET | Freq: Every day | ORAL | 1 refills | Status: DC

## 2019-10-11 NOTE — Patient Instructions (Addendum)
-  Nice seeing you today!!  -Schedule follow up in 3 months for your blood pressure.

## 2019-10-11 NOTE — Progress Notes (Signed)
New Patient Office Visit     This visit occurred during the SARS-CoV-2 public health emergency.  Safety protocols were in place, including screening questions prior to the visit, additional usage of staff PPE, and extensive cleaning of exam room while observing appropriate contact time as indicated for disinfecting solutions.    CC/Reason for Visit: Establish care, hospital follow-up Previous PCP: Dr. Raliegh Ip Last Visit: Years ago  HPI: Dylan Fuller is a 54 y.o. male who is coming in today for the above mentioned reasons.  He had no past medical history up until his recent hospitalization earlier this month.  He is a Software engineer.  While at work he experienced dizziness and right numbness and went to the emergency department where he was subsequently diagnosed with a left pontine CVA.  During that hospitalization he was also noticed to have elevated blood pressure and hyperlipidemia.  He was started on Norvasc 5 mg, Lipitor 40 mg, aspirin 81 mg and Plavix 75 mg will be on dual antiplatelet therapy for 3 weeks and then will transition over to aspirin alone.  He is doing well, he is back to work, he has no residual deficits from the stroke.  He has had both of his Covid vaccines.  He had a colonoscopy in 2018 and is a 5-year callback due to polyps.   Past Medical/Surgical History: Past Medical History:  Diagnosis Date  . Constipation   . CVA (cerebral vascular accident) ([place])    09/2019  . Hyperlipidemia   . Hypertension   . Obesity (BMI 30.0-34.9)   . Prostatitis, chronic     Past Surgical History:  Procedure Laterality Date  . HERNIA REPAIR     age 33 - ?ingunial x2    Social History:  reports that he has never smoked. He has never used smokeless tobacco. He reports current alcohol use. He reports that he does not use drugs.  Allergies: No Known Allergies  Family History:  Family History  Problem Relation Age of Onset  . Arthritis Mother   . Heart disease Mother   .  Hyperlipidemia Mother   . Hypertension Mother   . Dementia Mother   . Heart disease Father        67; post-rheumatic fever valve disease  . Colon cancer Neg Hx   . Colon polyps Neg Hx   . Esophageal cancer Neg Hx   . Rectal cancer Neg Hx   . Stomach cancer Neg Hx   . Stroke Neg Hx      Current Outpatient Medications:  .  amLODipine (NORVASC) 5 MG tablet, Take 1 tablet (5 mg total) by mouth daily., Disp: 90 tablet, Rfl: 1 .  aspirin EC 81 MG tablet, Take 1 tablet (81 mg total) by mouth daily., Disp: 30 tablet, Rfl: 0 .  atorvastatin (LIPITOR) 40 MG tablet, Take 1 tablet (40 mg total) by mouth daily., Disp: 90 tablet, Rfl: 1 .  clopidogrel (PLAVIX) 75 MG tablet, Take 1 tablet (75 mg total) by mouth daily for 21 days., Disp: 21 tablet, Rfl: 0  Review of Systems:  Constitutional: Denies fever, chills, diaphoresis, appetite change and fatigue.  HEENT: Denies photophobia, eye pain, redness, hearing loss, ear pain, congestion, sore throat, rhinorrhea, sneezing, mouth sores, trouble swallowing, neck pain, neck stiffness and tinnitus.   Respiratory: Denies SOB, DOE, cough, chest tightness,  and wheezing.   Cardiovascular: Denies chest pain, palpitations and leg swelling.  Gastrointestinal: Denies nausea, vomiting, abdominal pain, diarrhea, constipation, blood in stool and  abdominal distention.  Genitourinary: Denies dysuria, urgency, frequency, hematuria, flank pain and difficulty urinating.  Endocrine: Denies: hot or cold intolerance, sweats, changes in hair or nails, polyuria, polydipsia. Musculoskeletal: Denies myalgias, back pain, joint swelling, arthralgias and gait problem.  Skin: Denies pallor, rash and wound.  Neurological: Denies dizziness, seizures, syncope, weakness, light-headedness, numbness and headaches.  Hematological: Denies adenopathy. Easy bruising, personal or family bleeding history  Psychiatric/Behavioral: Denies suicidal ideation, mood changes, confusion, nervousness,  sleep disturbance and agitation    Physical Exam: Vitals:   10/11/19 1354  BP: 130/90  Pulse: 84  Temp: 98.2 F (36.8 C)  TempSrc: Temporal  SpO2: 94%  Weight: 235 lb 14.4 oz (107 kg)  Height: 6' (1.829 m)   Body mass index is 31.99 kg/m.  Constitutional: NAD, calm, comfortable Eyes: PERRL, lids and conjunctivae normal ENMT: Mucous membranes are moist. Respiratory: clear to auscultation bilaterally, no wheezing, no crackles. Normal respiratory effort. No accessory muscle use.  Cardiovascular: Regular rate and rhythm, no murmurs / rubs / gallops. No extremity edema.  Neurologic: Grossly intact and nonfocal Psychiatric: Normal judgment and insight. Alert and oriented x 3. Normal mood.    Impression and Plan:  Hospital discharge follow-up Cerebrovascular accident (CVA), unspecified mechanism ([place]) -He is doing well, has no residual deficits, dual antiplatelet therapy for 3 weeks was the plan with eventual transition to aspirin alone.  Continue aggressive risk factor modification.  Essential hypertension -Blood pressure is above goal today at 130/90. -He has only been on Norvasc for 2 weeks. -I will schedule a 90-month follow-up for him.  Hyperlipidemia, unspecified hyperlipidemia type  -Refill Lipitor.  Obesity (BMI 30.0-34.9) -Discussed healthy lifestyle, including increased physical activity and better food choices to promote weight loss.  History of colonic polyps -Had colonoscopy in 2018 and is due for redo in 2023.     Patient Instructions  -Nice seeing you today!!  -Schedule follow up in 3 months for your blood pressure.     Lelon Frohlich, MD [facility]

## 2019-11-13 ENCOUNTER — Encounter: Payer: Self-pay | Admitting: Internal Medicine

## 2019-11-30 ENCOUNTER — Ambulatory Visit (INDEPENDENT_AMBULATORY_CARE_PROVIDER_SITE_OTHER): Payer: Managed Care, Other (non HMO) | Admitting: Neurology

## 2019-11-30 ENCOUNTER — Encounter: Payer: Self-pay | Admitting: Neurology

## 2019-11-30 VITALS — BP 133/95 | HR 68 | Ht 72.0 in | Wt 232.0 lb

## 2019-11-30 DIAGNOSIS — I6381 Other cerebral infarction due to occlusion or stenosis of small artery: Secondary | ICD-10-CM | POA: Diagnosis not present

## 2019-11-30 NOTE — Patient Instructions (Signed)
I had a long d/w patient about his recent lacunar  strokes, risk for recurrent stroke/TIAs, personally independently reviewed imaging studies and stroke evaluation results and answered questions.Continue aspirin 81 mg daily  for secondary stroke prevention and maintain strict control of hypertension with blood pressure goal below 130/90, diabetes with hemoglobin A1c goal below 6.5% and lipids with LDL cholesterol goal below 70 mg/dL. I also advised the patient to eat a healthy diet with plenty of whole grains, cereals, fruits and vegetables, exercise regularly and maintain ideal body weight Followup in the future with my nurse practitioner Janett Billow  In 6 months or call earlier if needed.  Stroke Prevention Some medical conditions and behaviors are associated with a higher chance of having a stroke. You can help prevent a stroke by making nutrition, lifestyle, and other changes, including managing any medical conditions you may have. What nutrition changes can be made?   Eat healthy foods. You can do this by: ? Choosing foods high in fiber, such as fresh fruits and vegetables and whole grains. ? Eating at least 5 or more servings of fruits and vegetables a day. Try to fill half of your plate at each meal with fruits and vegetables. ? Choosing lean protein foods, such as lean cuts of meat, poultry without skin, fish, tofu, beans, and nuts. ? Eating low-fat dairy products. ? Avoiding foods that are high in salt (sodium). This can help lower blood pressure. ? Avoiding foods that have saturated fat, trans fat, and cholesterol. This can help prevent high cholesterol. ? Avoiding processed and premade foods.  Follow your health care provider's specific guidelines for losing weight, controlling high blood pressure (hypertension), lowering high cholesterol, and managing diabetes. These may include: ? Reducing your daily calorie intake. ? Limiting your daily sodium intake to 1,500 milligrams (mg). ? Using  only healthy fats for cooking, such as olive oil, canola oil, or sunflower oil. ? Counting your daily carbohydrate intake. What lifestyle changes can be made?  Maintain a healthy weight. Talk to your health care provider about your ideal weight.  Get at least 30 minutes of moderate physical activity at least 5 days a week. Moderate activity includes brisk walking, biking, and swimming.  Do not use any products that contain nicotine or tobacco, such as cigarettes and e-cigarettes. If you need help quitting, ask your health care provider. It may also be helpful to avoid exposure to secondhand smoke.  Limit alcohol intake to no more than 1 drink a day for nonpregnant women and 2 drinks a day for men. One drink equals 12 oz of beer, 5 oz of wine, or 1 oz of hard liquor.  Stop any illegal drug use.  Avoid taking birth control pills. Talk to your health care provider about the risks of taking birth control pills if: ? You are over 81 years old. ? You smoke. ? You get migraines. ? You have ever had a blood clot. What other changes can be made?  Manage your cholesterol levels. ? Eating a healthy diet is important for preventing high cholesterol. If cholesterol cannot be managed through diet alone, you may also need to take medicines. ? Take any prescribed medicines to control your cholesterol as told by your health care provider.  Manage your diabetes. ? Eating a healthy diet and exercising regularly are important parts of managing your blood sugar. If your blood sugar cannot be managed through diet and exercise, you may need to take medicines. ? Take any prescribed medicines to control  your diabetes as told by your health care provider.  Control your hypertension. ? To reduce your risk of stroke, try to keep your blood pressure below 130/80. ? Eating a healthy diet and exercising regularly are an important part of controlling your blood pressure. If your blood pressure cannot be managed  through diet and exercise, you may need to take medicines. ? Take any prescribed medicines to control hypertension as told by your health care provider. ? Ask your health care provider if you should monitor your blood pressure at home. ? Have your blood pressure checked every year, even if your blood pressure is normal. Blood pressure increases with age and some medical conditions.  Get evaluated for sleep disorders (sleep apnea). Talk to your health care provider about getting a sleep evaluation if you snore a lot or have excessive sleepiness.  Take over-the-counter and prescription medicines only as told by your health care provider. Aspirin or blood thinners (antiplatelets or anticoagulants) may be recommended to reduce your risk of forming blood clots that can lead to stroke.  Make sure that any other medical conditions you have, such as atrial fibrillation or atherosclerosis, are managed. What are the warning signs of a stroke? The warning signs of a stroke can be easily remembered as BEFAST.  B is for balance. Signs include: ? Dizziness. ? Loss of balance or coordination. ? Sudden trouble walking.  E is for eyes. Signs include: ? A sudden change in vision. ? Trouble seeing.  F is for face. Signs include: ? Sudden weakness or numbness of the face. ? The face or eyelid drooping to one side.  A is for arms. Signs include: ? Sudden weakness or numbness of the arm, usually on one side of the body.  S is for speech. Signs include: ? Trouble speaking (aphasia). ? Trouble understanding.  T is for time. ? These symptoms may represent a serious problem that is an emergency. Do not wait to see if the symptoms will go away. Get medical help right away. Call your local emergency services (911 in the [place] Do not drive yourself to the hospital.  Other signs of stroke may include: ? A sudden, severe headache with no known cause. ? Nausea or vomiting. ? Seizure. Where to find more  information For more information, visit:  American Stroke Association: www.strokeassociation.org  National Stroke Association: www.stroke.org Summary  You can prevent a stroke by eating healthy, exercising, not smoking, limiting alcohol intake, and managing any medical conditions you may have.  Do not use any products that contain nicotine or tobacco, such as cigarettes and e-cigarettes. If you need help quitting, ask your health care provider. It may also be helpful to avoid exposure to secondhand smoke.  Remember BEFAST for warning signs of stroke. Get help right away if you or a loved one has any of these signs. This information is not intended to replace advice given to you by your health care provider. Make sure you discuss any questions you have with your health care provider. Document Revised: 05/14/2017 Document Reviewed: 07/07/2016 Elsevier Patient Education  2020 [employer].

## 2019-11-30 NOTE — Progress Notes (Signed)
[facility] [address] [place]. [place] [address] [phone]       OFFICE FOLLOW-UP NOTE  [name] Date of Birth:  1965-10-18 Medical Record Number:  [id number]   HPI: Dylan Fuller is a 54-year-old Caucasian male seen today for initial office follow-up visit following hospital consultation for stroke in April 2021.  History is obtained from the patient, review of electronic medical records and I personally reviewed imaging films in PACS.  He presented on 09/28/2019 with sudden onset of dizziness and imbalance while at work.  He was somehow able to drive himself home but continued to experience the symptoms as well as numbness in the right side of his arm and legs and his blood pressure was elevated.  He came to the emergency room by his family and symptoms did not resolve.  Transient beyond time window for TPA.  CT scan of the head showed no acute abnormality but showed chronic lacunar infarcts in the right basal ganglia and parietal area.  CT angiogram of the head showed no large vessel occlusion but showed severe stenosis of the right P1 and P3 posterior cerebral arteries.  CT angiogram of the neck showed mild atherosclerosis involving the carotid siphons.  MRI showed acute lacunar infarct in the left pons and right sided subacute coronary radiata/ posterior lentiform nucleus infarct.  2D echo showed normal ejection fraction without cardiac source of embolism.  LDL cholesterol is elevated at 1 1 4  mg percent.  Hemoglobin A1c was 5.3.  Patient was started on aspirin Plavix for 3 weeks followed now by aspirin alone.  He states is doing well.  Is made full neurological recovery.  He has no residual deficits and is back to his baseline.  His blood pressures well controlled on Norvasc and today it is 133/95.  He is tolerating Lipitor well without muscle aches and pains.  He remains on aspirin and is tolerating well without upset stomach, bruising or bleeding.  He has no  complaints today.  ROS:   14 system review of systems is positive for right shoulder pain only all other systems negative  PMH:  Past Medical History:  Diagnosis Date  . Constipation   . CVA (cerebral vascular accident) ([place])    09/2019  . Hyperlipidemia   . Hypertension   . Obesity (BMI 30.0-34.9)   . Prostatitis, chronic     Social History:  Social History   Socioeconomic History  . Marital status: Married    Spouse name: Not on file  . Number of children: Not on file  . Years of education: Not on file  . Highest education level: Not on file  Occupational History  . Occupation: pharmacist  Tobacco Use  . Smoking status: Never Smoker  . Smokeless tobacco: Never Used  . Tobacco comment: pt states he has smoked a few cigarettes in his lifetime only   Substance and Sexual Activity  . Alcohol use: Yes    Comment: social  . Drug use: No  . Sexual activity: Not on file  Other Topics Concern  . Not on file  Social History Narrative  . Not on file   Social Determinants of Health   Financial Resource Strain:   . Difficulty of Paying Living Expenses:   Food Insecurity:   . Worried About Charity fundraiser in the Last Year:   . Arboriculturist in the Last Year:   Transportation Needs:   . Film/video editor (Medical):   Marland Kitchen  Lack of Transportation (Non-Medical):   Physical Activity:   . Days of Exercise per Week:   . Minutes of Exercise per Session:   Stress:   . Feeling of Stress :   Social Connections:   . Frequency of Communication with Friends and Family:   . Frequency of Social Gatherings with Friends and Family:   . Attends Religious Services:   . Active Member of Clubs or Organizations:   . Attends Archivist Meetings:   Marland Kitchen Marital Status:   Intimate Partner Violence:   . Fear of Current or Ex-Partner:   . Emotionally Abused:   Marland Kitchen Physically Abused:   . Sexually Abused:     Medications:   Current Outpatient Medications on File Prior to  Visit  Medication Sig Dispense Refill  . amLODipine (NORVASC) 5 MG tablet Take 1 tablet (5 mg total) by mouth daily. 90 tablet 1  . aspirin EC 81 MG tablet Take 1 tablet (81 mg total) by mouth daily. 30 tablet 0  . atorvastatin (LIPITOR) 40 MG tablet Take 1 tablet (40 mg total) by mouth daily. 90 tablet 1   No current facility-administered medications on file prior to visit.    Allergies:  No Known Allergies  Physical Exam General: Mildly obese middle-aged Caucasian male, seated, in no evident distress Head: head normocephalic and atraumatic.  Neck: supple with no carotid or supraclavicular bruits Cardiovascular: regular rate and rhythm, no murmurs Musculoskeletal: no deformity Skin:  no rash/petichiae Vascular:  Normal pulses all extremities Vitals:   11/30/19 1536  BP: (!) 133/95  Pulse: 68   Neurologic Exam Mental Status: Awake and fully alert. Oriented to place and time. Recent and remote memory intact. Attention span, concentration and fund of knowledge appropriate. Mood and affect appropriate.  Cranial Nerves: Fundoscopic exam reveals sharp disc margins. Pupils equal, briskly reactive to light. Extraocular movements full without nystagmus. Visual fields full to confrontation. Hearing intact. Facial sensation intact. Face, tongue, palate moves normally and symmetrically.  Motor: Normal bulk and tone. Normal strength in all tested extremity muscles. Sensory.: intact to touch ,pinprick .position and vibratory sensation.  Coordination: Rapid alternating movements normal in all extremities. Finger-to-nose and heel-to-shin performed accurately bilaterally. Gait and Station: Arises from chair without difficulty. Stance is normal. Gait demonstrates normal stride length and balance . Able to heel, toe and tandem walk without difficulty.  Reflexes: 1+ and symmetric. Toes downgoing.   NIHSS 0 Modified Rankin  0   ASSESSMENT: 54 year old Caucasian male with left pontine and right  subcortical lacunar infarcts in April 2021 from small vessel disease.  Vascular risk factors of hypertension hyperlipidemia and mild obesity.  Patient is doing well with no residual deficits.     PLAN: I had a long d/w patient about his recent lacunar  strokes, risk for recurrent stroke/TIAs, personally independently reviewed imaging studies and stroke evaluation results and answered questions.Continue aspirin 81 mg daily  for secondary stroke prevention and maintain strict control of hypertension with blood pressure goal below 130/90, diabetes with hemoglobin A1c goal below 6.5% and lipids with LDL cholesterol goal below 70 mg/dL. I also advised the patient to eat a healthy diet with plenty of whole grains, cereals, fruits and vegetables, exercise regularly and maintain ideal body weight Followup in the future with my nurse practitioner Janett Billow  In 6 months or call earlier if needed. Greater than 50% of time during this 30 minute visit was spent on counseling,explanation of diagnosis of lacunar stroke, planning of further management, discussion  with patient and family and coordination of care Antony Contras, MD  [facility] [address] [place], [place] [address]  Phone [phone] Fax [phone] Note: This document was prepared with digital dictation and possible smart phrase technology. Any transcriptional errors that result from this process are unintentional

## 2020-01-12 ENCOUNTER — Ambulatory Visit (INDEPENDENT_AMBULATORY_CARE_PROVIDER_SITE_OTHER): Payer: Managed Care, Other (non HMO) | Admitting: Internal Medicine

## 2020-01-12 ENCOUNTER — Encounter: Payer: Self-pay | Admitting: Internal Medicine

## 2020-01-12 VITALS — BP 120/82 | HR 91 | Temp 98.4°F | Wt 233.3 lb

## 2020-01-12 DIAGNOSIS — I639 Cerebral infarction, unspecified: Secondary | ICD-10-CM

## 2020-01-12 DIAGNOSIS — E785 Hyperlipidemia, unspecified: Secondary | ICD-10-CM | POA: Diagnosis not present

## 2020-01-12 DIAGNOSIS — H6123 Impacted cerumen, bilateral: Secondary | ICD-10-CM

## 2020-01-12 DIAGNOSIS — E669 Obesity, unspecified: Secondary | ICD-10-CM

## 2020-01-12 DIAGNOSIS — I10 Essential (primary) hypertension: Secondary | ICD-10-CM

## 2020-01-12 MED ORDER — ATORVASTATIN CALCIUM 40 MG PO TABS: 40.0000 mg | ORAL_TABLET | Freq: Every day | ORAL | 0 refills | Status: DC

## 2020-01-12 MED ORDER — AMLODIPINE BESYLATE 5 MG PO TABS: 5.0000 mg | ORAL_TABLET | Freq: Every day | ORAL | 0 refills | Status: DC

## 2020-01-12 NOTE — Progress Notes (Signed)
Established Patient Office Visit     This visit occurred during the SARS-CoV-2 public health emergency.  Safety protocols were in place, including screening questions prior to the visit, additional usage of staff PPE, and extensive cleaning of exam room while observing appropriate contact time as indicated for disinfecting solutions.    CC/Reason for Visit: Follow-up chronic conditions, bilateral ear pain  HPI: Dylan Fuller is a 54 y.o. male who is coming in today for the above mentioned reasons. Past Medical History is significant for: Hypertension, hyperlipidemia and a left pontine CVA in April 2021.  He is here today for follow-up.  He has been doing well.  He has been checking his blood pressures at home and they have been well controlled.  He went to the beach last week and believes he has either water or wax in his ears that is causing some pain, worse on the left side as well as decreased hearing.   Past Medical/Surgical History: Past Medical History:  Diagnosis Date  . Constipation   . CVA (cerebral vascular accident) ([place])    09/2019  . Hyperlipidemia   . Hypertension   . Obesity (BMI 30.0-34.9)   . Prostatitis, chronic     Past Surgical History:  Procedure Laterality Date  . HERNIA REPAIR     age 58 - ?ingunial x2    Social History:  reports that he has never smoked. He has never used smokeless tobacco. He reports current alcohol use. He reports that he does not use drugs.  Allergies: No Known Allergies  Family History:  Family History  Problem Relation Age of Onset  . Arthritis Mother   . Heart disease Mother   . Hyperlipidemia Mother   . Hypertension Mother   . Dementia Mother   . Heart disease Father        51; post-rheumatic fever valve disease  . Colon cancer Neg Hx   . Colon polyps Neg Hx   . Esophageal cancer Neg Hx   . Rectal cancer Neg Hx   . Stomach cancer Neg Hx   . Stroke Neg Hx      Current Outpatient Medications:  .  amLODipine  (NORVASC) 5 MG tablet, Take 1 tablet (5 mg total) by mouth daily., Disp: 90 tablet, Rfl: 0 .  aspirin EC 81 MG tablet, Take 1 tablet (81 mg total) by mouth daily., Disp: 30 tablet, Rfl: 0 .  atorvastatin (LIPITOR) 40 MG tablet, Take 1 tablet (40 mg total) by mouth daily., Disp: 90 tablet, Rfl: 0  Review of Systems:  Constitutional: Denies fever, chills, diaphoresis, appetite change and fatigue.  HEENT: Denies photophobia, eye pain, redness,  congestion, sore throat, rhinorrhea, sneezing, mouth sores, trouble swallowing, neck pain, neck stiffness and tinnitus.   Respiratory: Denies SOB, DOE, cough, chest tightness,  and wheezing.   Cardiovascular: Denies chest pain, palpitations and leg swelling.  Gastrointestinal: Denies nausea, vomiting, abdominal pain, diarrhea, constipation, blood in stool and abdominal distention.  Genitourinary: Denies dysuria, urgency, frequency, hematuria, flank pain and difficulty urinating.  Endocrine: Denies: hot or cold intolerance, sweats, changes in hair or nails, polyuria, polydipsia. Musculoskeletal: Denies myalgias, back pain, joint swelling, arthralgias and gait problem.  Skin: Denies pallor, rash and wound.  Neurological: Denies dizziness, seizures, syncope, weakness, light-headedness, numbness and headaches.  Hematological: Denies adenopathy. Easy bruising, personal or family bleeding history  Psychiatric/Behavioral: Denies suicidal ideation, mood changes, confusion, nervousness, sleep disturbance and agitation    Physical Exam: Vitals:   01/12/20  1443  BP: 120/82  Pulse: 91  Temp: 98.4 F (36.9 C)  TempSrc: Oral  SpO2: 95%  Weight: (!) 233 lb 4.8 oz (105.8 kg)    Body mass index is 31.64 kg/m.   Constitutional: NAD, calm, comfortable Eyes: PERRL, lids and conjunctivae normal ENMT: Mucous membranes are moist. Tympanic membrane is obstructed by cerumen bilaterally Neck: normal, supple, no masses, no thyromegaly Respiratory: clear to  auscultation bilaterally, no wheezing, no crackles. Normal respiratory effort. No accessory muscle use.  Cardiovascular: Regular rate and rhythm, no murmurs / rubs / gallops. No extremity edema.  Neurologic: Grossly intact and nonfocal. Psychiatric: Normal judgment and insight. Alert and oriented x 3. Normal mood.    Impression and Plan:  Cerebrovascular accident (CVA), unspecified mechanism ([place]) -Doing well, has had follow-up with neurology, is now off Plavix.  Essential hypertension  - Plan: amLODipine (NORVASC) 5 MG tablet -Well-controlled.  Hyperlipidemia, unspecified hyperlipidemia type  - Plan: atorvastatin (LIPITOR) 40 MG tablet -Recheck lipids when he returns for visit fasting in 3-4 months.  Obesity (BMI 30.0-34.9) -Discussed healthy lifestyle, including increased physical activity and better food choices to promote weight loss.   Impacted cerumen, bilateral -Cerumen Desimpaction  After patient consent was obtained, warm water was applied and gentle ear lavage performed on bilateral ears. There were no complications and following the desimpaction the tympanic membranes were visible. Tympanic membranes are intact following the procedure. Auditory canals are normal. The patient reported relief of symptoms after removal of cerumen.     Patient Instructions  -Nice seeing you today!!  -Schedule follow up in 4 months for your physical. Please come in fasting that day.     Lelon Frohlich, MD [facility]

## 2020-01-12 NOTE — Patient Instructions (Signed)
-  Nice seeing you today!!  -Schedule follow up in 4 months for your physical. Please come in fasting that day.

## 2020-04-12 ENCOUNTER — Ambulatory Visit: Payer: Managed Care, Other (non HMO) | Admitting: Internal Medicine

## 2020-04-16 ENCOUNTER — Encounter: Payer: Managed Care, Other (non HMO) | Admitting: Internal Medicine

## 2020-04-19 ENCOUNTER — Encounter: Payer: Managed Care, Other (non HMO) | Admitting: Internal Medicine

## 2020-06-28 ENCOUNTER — Other Ambulatory Visit: Payer: Self-pay

## 2020-06-28 ENCOUNTER — Encounter: Payer: Self-pay | Admitting: Internal Medicine

## 2020-06-28 ENCOUNTER — Other Ambulatory Visit: Payer: Self-pay | Admitting: Internal Medicine

## 2020-06-28 ENCOUNTER — Ambulatory Visit (INDEPENDENT_AMBULATORY_CARE_PROVIDER_SITE_OTHER): Payer: Managed Care, Other (non HMO) | Admitting: Internal Medicine

## 2020-06-28 VITALS — BP 110/80 | HR 80 | Temp 98.2°F | Ht 71.0 in | Wt 240.5 lb

## 2020-06-28 DIAGNOSIS — Z0000 Encounter for general adult medical examination without abnormal findings: Secondary | ICD-10-CM | POA: Diagnosis not present

## 2020-06-28 DIAGNOSIS — Z86010 Personal history of colonic polyps: Secondary | ICD-10-CM

## 2020-06-28 DIAGNOSIS — E785 Hyperlipidemia, unspecified: Secondary | ICD-10-CM

## 2020-06-28 DIAGNOSIS — I10 Essential (primary) hypertension: Secondary | ICD-10-CM

## 2020-06-28 DIAGNOSIS — E559 Vitamin D deficiency, unspecified: Secondary | ICD-10-CM | POA: Insufficient documentation

## 2020-06-28 DIAGNOSIS — I639 Cerebral infarction, unspecified: Secondary | ICD-10-CM

## 2020-06-28 DIAGNOSIS — E669 Obesity, unspecified: Secondary | ICD-10-CM | POA: Diagnosis not present

## 2020-06-28 DIAGNOSIS — Z23 Encounter for immunization: Secondary | ICD-10-CM

## 2020-06-28 LAB — CBC WITH DIFFERENTIAL/PLATELET
Basophils Absolute: 0 10*3/uL (ref 0.0–0.1)
Basophils Relative: 0.6 % (ref 0.0–3.0)
Eosinophils Absolute: 0.4 10*3/uL (ref 0.0–0.7)
Eosinophils Relative: 5.3 % — ABNORMAL HIGH (ref 0.0–5.0)
HCT: 46.8 % (ref 39.0–52.0)
Hemoglobin: 15.8 g/dL (ref 13.0–17.0)
Lymphocytes Relative: 30.2 % (ref 12.0–46.0)
Lymphs Abs: 2.1 10*3/uL (ref 0.7–4.0)
MCHC: 33.8 g/dL (ref 30.0–36.0)
MCV: 87.3 fl (ref 78.0–100.0)
Monocytes Absolute: 0.5 10*3/uL (ref 0.1–1.0)
Monocytes Relative: 7.2 % (ref 3.0–12.0)
Neutro Abs: 3.9 10*3/uL (ref 1.4–7.7)
Neutrophils Relative %: 56.7 % (ref 43.0–77.0)
Platelets: 253 10*3/uL (ref 150.0–400.0)
RBC: 5.37 Mil/uL (ref 4.22–5.81)
RDW: 13.7 % (ref 11.5–15.5)
WBC: 6.9 10*3/uL (ref 4.0–10.5)

## 2020-06-28 LAB — COMPREHENSIVE METABOLIC PANEL
ALT: 44 U/L (ref 0–53)
AST: 24 U/L (ref 0–37)
Albumin: 4.8 g/dL (ref 3.5–5.2)
Alkaline Phosphatase: 87 U/L (ref 39–117)
BUN: 13 mg/dL (ref 6–23)
CO2: 27 mEq/L (ref 19–32)
Calcium: 9.5 mg/dL (ref 8.4–10.5)
Chloride: 104 mEq/L (ref 96–112)
Creatinine, Ser: 0.78 mg/dL (ref 0.40–1.50)
GFR: 99.83 mL/min (ref >60.00)
Glucose, Bld: 103 mg/dL — ABNORMAL HIGH (ref 70–99)
Potassium: 4.2 mEq/L (ref 3.5–5.1)
Sodium: 138 mEq/L (ref 135–145)
Total Bilirubin: 0.9 mg/dL (ref 0.2–1.2)
Total Protein: 7 g/dL (ref 6.0–8.3)

## 2020-06-28 LAB — LIPID PANEL
Cholesterol: 101 mg/dL (ref 0–200)
HDL: 37 mg/dL — ABNORMAL LOW (ref >39.00)
LDL Cholesterol: 50 mg/dL (ref 0–99)
NonHDL: 64.3
Total CHOL/HDL Ratio: 3
Triglycerides: 74 mg/dL (ref 0.0–149.0)
VLDL: 14.8 mg/dL (ref 0.0–40.0)

## 2020-06-28 LAB — TSH: TSH: 1.08 u[IU]/mL (ref 0.35–4.50)

## 2020-06-28 LAB — VITAMIN B12: Vitamin B-12: 264 pg/mL (ref 211–911)

## 2020-06-28 LAB — VITAMIN D 25 HYDROXY (VIT D DEFICIENCY, FRACTURES): VITD: 11.75 ng/mL — ABNORMAL LOW (ref 30.00–100.00)

## 2020-06-28 LAB — HEMOGLOBIN A1C: Hgb A1c MFr Bld: 5.4 % (ref 4.6–6.5)

## 2020-06-28 LAB — PSA: PSA: 1.25 ng/mL (ref 0.10–4.00)

## 2020-06-28 MED ORDER — VITAMIN D (ERGOCALCIFEROL) 1.25 MG (50000 UNIT) PO CAPS: 50000.0000 [IU] | ORAL_CAPSULE | ORAL | 0 refills | Status: AC

## 2020-06-28 MED ORDER — ATORVASTATIN CALCIUM 40 MG PO TABS: 40.0000 mg | ORAL_TABLET | Freq: Every day | ORAL | 2 refills | Status: DC

## 2020-06-28 MED ORDER — AMLODIPINE BESYLATE 5 MG PO TABS: 5.0000 mg | ORAL_TABLET | Freq: Every day | ORAL | 2 refills | Status: DC

## 2020-06-28 NOTE — Patient Instructions (Signed)
-Nice seeing you today!!  -Lab work today; will notify you once results are available.  -Tdap and first shingles today.  -Schedule follow up in 6 months.   Preventive Care 39-57 Years Old, Male Preventive care refers to lifestyle choices and visits with your health care provider that can promote health and wellness. This includes:  A yearly physical exam. This is also called an annual wellness visit.  Regular dental and eye exams.  Immunizations.  Screening for certain conditions.  Healthy lifestyle choices, such as: ? Eating a healthy diet. ? Getting regular exercise. ? Not using drugs or products that contain nicotine and tobacco. ? Limiting alcohol use. What can I expect for my preventive care visit? Physical exam Your health care provider will check your:  Height and weight. These may be used to calculate your BMI (body mass index). BMI is a measurement that tells if you are at a healthy weight.  Heart rate and blood pressure.  Body temperature.  Skin for abnormal spots. Counseling Your health care provider may ask you questions about your:  Past medical problems.  Family's medical history.  Alcohol, tobacco, and drug use.  Emotional well-being.  Home life and relationship well-being.  Sexual activity.  Diet, exercise, and sleep habits.  Work and work Statistician.  Access to firearms. What immunizations do I need? Vaccines are usually given at various ages, according to a schedule. Your health care provider will recommend vaccines for you based on your age, medical history, and lifestyle or other factors, such as travel or where you work.   What tests do I need? Blood tests  Lipid and cholesterol levels. These may be checked every 5 years, or more often if you are over 70 years old.  Hepatitis C test.  Hepatitis B test. Screening  Lung cancer screening. You may have this screening every year starting at age 67 if you have a 30-pack-year history  of smoking and currently smoke or have quit within the past 15 years.  Prostate cancer screening. Recommendations will vary depending on your family history and other risks.  Genital exam to check for testicular cancer or hernias.  Colorectal cancer screening. ? All adults should have this screening starting at age 75 and continuing until age 23. ? Your health care provider may recommend screening at age 51 if you are at increased risk. ? You will have tests every 1-10 years, depending on your results and the type of screening test.  Diabetes screening. ? This is done by checking your blood sugar (glucose) after you have not eaten for a while (fasting). ? You may have this done every 1-3 years.  STD (sexually transmitted disease) testing, if you are at risk. Follow these instructions at home: Eating and drinking  Eat a diet that includes fresh fruits and vegetables, whole grains, lean protein, and low-fat dairy products.  Take vitamin and mineral supplements as recommended by your health care provider.  Do not drink alcohol if your health care provider tells you not to drink.  If you drink alcohol: ? Limit how much you have to 0-2 drinks a day. ? Be aware of how much alcohol is in your drink. In the [place] one drink equals one 12 oz bottle of beer (355 mL), one 5 oz glass of wine (148 mL), or one 1 oz glass of hard liquor (44 mL).   Lifestyle  Take daily care of your teeth and gums. Brush your teeth every morning and night with fluoride toothpaste.  Floss one time each day.  Stay active. Exercise for at least 30 minutes 5 or more days each week.  Do not use any products that contain nicotine or tobacco, such as cigarettes, e-cigarettes, and chewing tobacco. If you need help quitting, ask your health care provider.  Do not use drugs.  If you are sexually active, practice safe sex. Use a condom or other form of protection to prevent STIs (sexually transmitted infections).  If  told by your health care provider, take low-dose aspirin daily starting at age 51.  Find healthy ways to cope with stress, such as: ? Meditation, yoga, or listening to music. ? Journaling. ? Talking to a trusted person. ? Spending time with friends and family. Safety  Always wear your seat belt while driving or riding in a vehicle.  Do not drive: ? If you have been drinking alcohol. Do not ride with someone who has been drinking. ? When you are tired or distracted. ? While texting.  Wear a helmet and other protective equipment during sports activities.  If you have firearms in your house, make sure you follow all gun safety procedures. What's next?  Go to your health care provider once a year for an annual wellness visit.  Ask your health care provider how often you should have your eyes and teeth checked.  Stay up to date on all vaccines. This information is not intended to replace advice given to you by your health care provider. Make sure you discuss any questions you have with your health care provider. Document Revised: 02/28/2019 Document Reviewed: 05/26/2018 Elsevier Patient Education  2021 [employer].

## 2020-06-28 NOTE — Addendum Note (Signed)
Addended by: Westley Hummer B on: 06/28/2020 10:58 AM   Modules accepted: Orders

## 2020-06-28 NOTE — Addendum Note (Signed)
Addended by: Jenness Corner L on: 06/28/2020 11:01 AM   Modules accepted: Orders

## 2020-06-28 NOTE — Progress Notes (Signed)
Established Patient Office Visit     This visit occurred during the SARS-CoV-2 public health emergency.  Safety protocols were in place, including screening questions prior to the visit, additional usage of staff PPE, and extensive cleaning of exam room while observing appropriate contact time as indicated for disinfecting solutions.    CC/Reason for Visit: Annual preventive exam  HPI: Dylan Fuller is a 55 y.o. male who is coming in today for the above mentioned reasons. Past Medical History is significant for: Hypertension, hyperlipidemia as well as a left pontine CVA in April 2021.  He has been doing well since we last spoke.  He has had all 3 COVID vaccines.  He has had his flu vaccine.  He is due for Tdap and shingles vaccination series.  He had a colonoscopy in 2018.  He has routine dental care but no eye care.  He does not exercise routinely.   Past Medical/Surgical History: Past Medical History:  Diagnosis Date  . Constipation   . CVA (cerebral vascular accident) ([place])    09/2019  . Hyperlipidemia   . Hypertension   . Obesity (BMI 30.0-34.9)   . Prostatitis, chronic     Past Surgical History:  Procedure Laterality Date  . HERNIA REPAIR     age 17 - ?ingunial x2    Social History:  reports that he has never smoked. He has never used smokeless tobacco. He reports current alcohol use. He reports that he does not use drugs.  Allergies: No Known Allergies  Family History:  Family History  Problem Relation Age of Onset  . Arthritis Mother   . Heart disease Mother   . Hyperlipidemia Mother   . Hypertension Mother   . Dementia Mother   . Heart disease Father        4; post-rheumatic fever valve disease  . Colon cancer Neg Hx   . Colon polyps Neg Hx   . Esophageal cancer Neg Hx   . Rectal cancer Neg Hx   . Stomach cancer Neg Hx   . Stroke Neg Hx      Current Outpatient Medications:  .  aspirin EC 81 MG tablet, Take 1 tablet (81 mg total) by mouth  daily., Disp: 30 tablet, Rfl: 0 .  amLODipine (NORVASC) 5 MG tablet, Take 1 tablet (5 mg total) by mouth daily., Disp: 90 tablet, Rfl: 2 .  atorvastatin (LIPITOR) 40 MG tablet, Take 1 tablet (40 mg total) by mouth daily., Disp: 90 tablet, Rfl: 2  Review of Systems:  Constitutional: Denies fever, chills, diaphoresis, appetite change and fatigue.  HEENT: Denies photophobia, eye pain, redness, hearing loss, ear pain, congestion, sore throat, rhinorrhea, sneezing, mouth sores, trouble swallowing, neck pain, neck stiffness and tinnitus.   Respiratory: Denies SOB, DOE, cough, chest tightness,  and wheezing.   Cardiovascular: Denies chest pain, palpitations and leg swelling.  Gastrointestinal: Denies nausea, vomiting, abdominal pain, diarrhea, constipation, blood in stool and abdominal distention.  Genitourinary: Denies dysuria, urgency, frequency, hematuria, flank pain and difficulty urinating.  Endocrine: Denies: hot or cold intolerance, sweats, changes in hair or nails, polyuria, polydipsia. Musculoskeletal: Denies myalgias, back pain, joint swelling, arthralgias and gait problem.  Skin: Denies pallor, rash and wound.  Neurological: Denies dizziness, seizures, syncope, weakness, light-headedness, numbness and headaches.  Hematological: Denies adenopathy. Easy bruising, personal or family bleeding history  Psychiatric/Behavioral: Denies suicidal ideation, mood changes, confusion, nervousness, sleep disturbance and agitation    Physical Exam: Vitals:   06/28/20 0957  BP:  110/80  Pulse: 80  Temp: 98.2 F (36.8 C)  TempSrc: Oral  SpO2: 98%  Weight: 240 lb 8 oz (109.1 kg)  Height: 5\' 11"  (1.803 m)    Body mass index is 33.54 kg/m.   Constitutional: NAD, calm, comfortable Eyes: PERRL, lids and conjunctivae normal ENMT: Mucous membranes are moist. Posterior pharynx clear of any exudate or lesions. Normal dentition. Tympanic membrane is pearly white, no erythema or bulging. Neck: normal,  supple, no masses, no thyromegaly Respiratory: clear to auscultation bilaterally, no wheezing, no crackles. Normal respiratory effort. No accessory muscle use.  Cardiovascular: Regular rate and rhythm, no murmurs / rubs / gallops. No extremity edema. 2+ pedal pulses. No carotid bruits.  Abdomen: no tenderness, no masses palpated. No hepatosplenomegaly. Bowel sounds positive.  Musculoskeletal: no clubbing / cyanosis. No joint deformity upper and lower extremities. Good ROM, no contractures. Normal muscle tone.  Skin: no rashes, lesions, ulcers. No induration Neurologic: CN 2-12 grossly intact. Sensation intact, DTR normal. Strength 5/5 in all 4.  Psychiatric: Normal judgment and insight. Alert and oriented x 3. Normal mood.    Impression and Plan:  Encounter for preventive health examination -He has routine dental care, have advised routine eye care. -For shingles and Tdap today, COVID and influenza are complete. -Screening labs today. -Healthy lifestyle discussed in detail. -PSA today for prostate cancer screening. -He had a colonoscopy in 2018 and is a 5-year callback.  Essential hypertension -Blood pressure is well controlled. -Continue amlodipine 5 mg daily.  Hyperlipidemia, unspecified hyperlipidemia type  - Plan: atorvastatin (LIPITOR) 40 MG tablet, Lipid panel -Last LDL was 114 at the time of his stroke.  Obesity (BMI 30.0-34.9) -Discussed healthy lifestyle, including increased physical activity and better food choices to promote weight loss.  Cerebrovascular accident (CVA), unspecified mechanism ([place]) -No residual deficits, on aspirin daily, plan is risk factor modification.  History of colonic polyps -He had a colonoscopy in 2018, 5-year follow-up.  Need for Tdap vaccination -Tdap administered today.  Need for shingles vaccine -First shingles vaccine administered today.   Patient Instructions   -Nice seeing you today!!  -Lab work today; will notify you once  results are available.  -Tdap and first shingles today.  -Schedule follow up in 6 months.   Preventive Care 81-65 Years Old, Male Preventive care refers to lifestyle choices and visits with your health care provider that can promote health and wellness. This includes:  A yearly physical exam. This is also called an annual wellness visit.  Regular dental and eye exams.  Immunizations.  Screening for certain conditions.  Healthy lifestyle choices, such as: ? Eating a healthy diet. ? Getting regular exercise. ? Not using drugs or products that contain nicotine and tobacco. ? Limiting alcohol use. What can I expect for my preventive care visit? Physical exam Your health care provider will check your:  Height and weight. These may be used to calculate your BMI (body mass index). BMI is a measurement that tells if you are at a healthy weight.  Heart rate and blood pressure.  Body temperature.  Skin for abnormal spots. Counseling Your health care provider may ask you questions about your:  Past medical problems.  Family's medical history.  Alcohol, tobacco, and drug use.  Emotional well-being.  Home life and relationship well-being.  Sexual activity.  Diet, exercise, and sleep habits.  Work and work Statistician.  Access to firearms. What immunizations do I need? Vaccines are usually given at various ages, according to a schedule. Your health  care provider will recommend vaccines for you based on your age, medical history, and lifestyle or other factors, such as travel or where you work.   What tests do I need? Blood tests  Lipid and cholesterol levels. These may be checked every 5 years, or more often if you are over 69 years old.  Hepatitis C test.  Hepatitis B test. Screening  Lung cancer screening. You may have this screening every year starting at age 41 if you have a 30-pack-year history of smoking and currently smoke or have quit within the past 15  years.  Prostate cancer screening. Recommendations will vary depending on your family history and other risks.  Genital exam to check for testicular cancer or hernias.  Colorectal cancer screening. ? All adults should have this screening starting at age 52 and continuing until age 11. ? Your health care provider may recommend screening at age 71 if you are at increased risk. ? You will have tests every 1-10 years, depending on your results and the type of screening test.  Diabetes screening. ? This is done by checking your blood sugar (glucose) after you have not eaten for a while (fasting). ? You may have this done every 1-3 years.  STD (sexually transmitted disease) testing, if you are at risk. Follow these instructions at home: Eating and drinking  Eat a diet that includes fresh fruits and vegetables, whole grains, lean protein, and low-fat dairy products.  Take vitamin and mineral supplements as recommended by your health care provider.  Do not drink alcohol if your health care provider tells you not to drink.  If you drink alcohol: ? Limit how much you have to 0-2 drinks a day. ? Be aware of how much alcohol is in your drink. In the [place] one drink equals one 12 oz bottle of beer (355 mL), one 5 oz glass of wine (148 mL), or one 1 oz glass of hard liquor (44 mL).   Lifestyle  Take daily care of your teeth and gums. Brush your teeth every morning and night with fluoride toothpaste. Floss one time each day.  Stay active. Exercise for at least 30 minutes 5 or more days each week.  Do not use any products that contain nicotine or tobacco, such as cigarettes, e-cigarettes, and chewing tobacco. If you need help quitting, ask your health care provider.  Do not use drugs.  If you are sexually active, practice safe sex. Use a condom or other form of protection to prevent STIs (sexually transmitted infections).  If told by your health care provider, take low-dose aspirin daily  starting at age 24.  Find healthy ways to cope with stress, such as: ? Meditation, yoga, or listening to music. ? Journaling. ? Talking to a trusted person. ? Spending time with friends and family. Safety  Always wear your seat belt while driving or riding in a vehicle.  Do not drive: ? If you have been drinking alcohol. Do not ride with someone who has been drinking. ? When you are tired or distracted. ? While texting.  Wear a helmet and other protective equipment during sports activities.  If you have firearms in your house, make sure you follow all gun safety procedures. What's next?  Go to your health care provider once a year for an annual wellness visit.  Ask your health care provider how often you should have your eyes and teeth checked.  Stay up to date on all vaccines. This information is not intended to  replace advice given to you by your health care provider. Make sure you discuss any questions you have with your health care provider. Document Revised: 02/28/2019 Document Reviewed: 05/26/2018 Elsevier Patient Education  2021 [place], MD [facility]

## 2020-07-02 ENCOUNTER — Other Ambulatory Visit: Payer: Self-pay | Admitting: Internal Medicine

## 2020-07-02 DIAGNOSIS — E559 Vitamin D deficiency, unspecified: Secondary | ICD-10-CM

## 2020-08-08 ENCOUNTER — Encounter: Payer: Self-pay | Admitting: Adult Health

## 2020-08-08 ENCOUNTER — Ambulatory Visit (INDEPENDENT_AMBULATORY_CARE_PROVIDER_SITE_OTHER): Payer: Managed Care, Other (non HMO) | Admitting: Adult Health

## 2020-08-08 VITALS — BP 129/86 | HR 75 | Ht 71.0 in | Wt 241.0 lb

## 2020-08-08 DIAGNOSIS — Z9189 Other specified personal risk factors, not elsewhere classified: Secondary | ICD-10-CM

## 2020-08-08 DIAGNOSIS — I639 Cerebral infarction, unspecified: Secondary | ICD-10-CM

## 2020-08-08 NOTE — Progress Notes (Signed)
[facility] [address] [place]. [place] [address] [phone]       OFFICE FOLLOW-UP NOTE  [name] Date of Birth:  28-Jan-1966 Medical Record Number:  [id number]    CC: stroke f/u   HPI:   Today, 08/08/2020, Dylan Fuller returns for 57-month stroke follow-up unaccompanied.    Stable since prior visit No residual deficits and denies new stroke/TIA symptoms Does report since his stroke he has noticed increased fatigue especially towards the end of the day.  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  His wife has noted that he snores but no issues with insomnia or nocturia.  He has not previously underwent sleep study.  Compliant on aspirin and atorvastatin -denies side effects Blood pressure today 129/86 - monitors at home and has been stable  Recent lab work 06/28/2020  LDL: 50 A1c: 5.4 Vit B12: 264 Vit D: 11.75    History provided for reference purposes only Initial visit 11/30/2019 Dr. Leonie Man: Dylan Fuller is a middle-aged Caucasian male seen today for initial office follow-up visit following hospital consultation for stroke in April 2021.  History is obtained from the patient, review of electronic medical records and I personally reviewed imaging films in PACS.  He presented on 09/28/2019 with sudden onset of dizziness and imbalance while at work.  He was somehow able to drive himself home but continued to experience the symptoms as well as numbness in the right side of his arm and legs and his blood pressure was elevated.  He came to the emergency room by his family and symptoms did not resolve.  Transient beyond time window for TPA.  CT scan of the head showed no acute abnormality but showed chronic lacunar infarcts in the right basal ganglia and parietal area.  CT angiogram of the head showed no large vessel occlusion but showed severe stenosis of the right P1 and P3 posterior cerebral arteries.  CT angiogram of the neck showed mild  atherosclerosis involving the carotid siphons.  MRI showed acute lacunar infarct in the left pons and right sided subacute coronary radiata/ posterior lentiform nucleus infarct.  2D echo showed normal ejection fraction without cardiac source of embolism.  LDL cholesterol is elevated at 1 1 4  mg percent.  Hemoglobin A1c was 5.3.  Patient was started on aspirin Plavix for 3 weeks followed now by aspirin alone.  He states is doing well.  Is made full neurological recovery.  He has no residual deficits and is back to his baseline.  His blood pressures well controlled on Norvasc and today it is 133/95.  He is tolerating Lipitor well without muscle aches and pains.  He remains on aspirin and is tolerating well without upset stomach, bruising or bleeding.  He has no complaints today.    ROS:   14 system review of systems is positive for fatigue only all other systems negative  PMH:  Past Medical History:  Diagnosis Date  . Constipation   . CVA (cerebral vascular accident) ([facility])    09/2019  . Hyperlipidemia   . Hypertension   . Obesity (BMI 30.0-34.9)   . Prostatitis, chronic     Social History:  Social History   Socioeconomic History  . Marital status: Married    Spouse name: Not on file  . Number of children: Not on file  . Years of education: Not on file  . Highest education level: Not on file  Occupational History  . Occupation: pharmacist  Tobacco Use  .  Smoking status: Never Smoker  . Smokeless tobacco: Never Used  . Tobacco comment: pt states he has smoked a few cigarettes in his lifetime only   Substance and Sexual Activity  . Alcohol use: Yes    Comment: social  . Drug use: No  . Sexual activity: Not on file  Other Topics Concern  . Not on file  Social History Narrative  . Not on file   Social Determinants of Health   Financial Resource Strain: Not on file  Food Insecurity: Not on file  Transportation Needs: Not on file  Physical Activity: Not on file  Stress: Not on  file  Social Connections: Not on file  Intimate Partner Violence: Not on file    Medications:   Current Outpatient Medications on File Prior to Visit  Medication Sig Dispense Refill  . amLODipine (NORVASC) 5 MG tablet Take 1 tablet (5 mg total) by mouth daily. 90 tablet 2  . aspirin EC 81 MG tablet Take 1 tablet (81 mg total) by mouth daily. 30 tablet 0  . atorvastatin (LIPITOR) 40 MG tablet Take 1 tablet (40 mg total) by mouth daily. 90 tablet 2  . Vitamin D, Ergocalciferol, (DRISDOL) 1.25 MG (50000 UNIT) CAPS capsule Take 1 capsule (50,000 Units total) by mouth every 7 (seven) days for 12 doses. 12 capsule 0   No current facility-administered medications on file prior to visit.    Allergies:  No Known Allergies  Physical Exam Today's Vitals   08/08/20 0732  BP: 129/86  Pulse: 75  Weight: 241 lb (109.3 kg)  Height: 5\' 11"  (1.803 m)   Body mass index is 33.61 kg/m.   General: Mildly obese very pleasant middle-aged Caucasian male, seated, in no evident distress Head: head normocephalic and atraumatic. Mallampati class 3 Neck: supple with no carotid or supraclavicular bruits; circumference 17.75" (45cm) Cardiovascular: regular rate and rhythm, no murmurs Musculoskeletal: no deformity Skin:  no rash/petichiae Vascular:  Normal pulses all extremities  Neurologic Exam Mental Status: Awake and fully alert.  Fluent speech and language.  Oriented to place and time. Recent and remote memory intact. Attention span, concentration and fund of knowledge appropriate. Mood and affect appropriate.  Cranial Nerves: Pupils equal, briskly reactive to light. Extraocular movements full without nystagmus. Visual fields full to confrontation. Hearing intact. Facial sensation intact. Face, tongue, palate moves normally and symmetrically.  Motor: Normal bulk and tone. Normal strength in all tested extremity muscles. Sensory.: intact to touch ,pinprick .position and vibratory sensation.   Coordination: Rapid alternating movements normal in all extremities. Finger-to-nose and heel-to-shin performed accurately bilaterally. Gait and Station: Arises from chair without difficulty. Stance is normal. Gait demonstrates normal stride length and balance without use of assistive device. Able to heel, toe and tandem walk without difficulty.  Reflexes: 1+ and symmetric. Toes downgoing.        ASSESSMENT/PLAN: 55 year old Caucasian male with left pontine and right subcortical lacunar infarcts in April 2021 from small vessel disease.  Vascular risk factors of hypertension hyperlipidemia and mild obesity.     L pontine stroke R subcortical stroke -Recovered well without residual deficit -Continue aspirin 81 mg daily and atorvastatin 40 mg daily for secondary stroke prevention and  Discussed secondary stroke prevention measures and importance of close PCP follow-up to maintain strict control of hypertension with blood pressure goal below 130/90, diabetes with hemoglobin A1c goal below 6.5% and lipids with LDL cholesterol goal below 70 mg/dL.    At risk for sleep apnea -Complaints of fatigue towards  the end of the day as well as snoring -STOP BANG 6 (snore, fatigue, HTN, age, neck and male) -hx of brainstem stroke as well as prior strokes -Referral placed to [facility] for further evaluation of possible sleep apnea -Also recommended starting B12 supplement as recent B12 level on the lower range of normal which could be contributing to his daytime fatigue -also advised to further discuss with PCP -Educated on increased stroke risk and cardiovascular disease with untreated sleep apnea    Overall stable from stroke standpoint routinely monitored by PCP with appropriate aggressive stroke risk factor management and advised to follow-up on an as-needed basis    CC:  [facility] provider: Dr. Lum Babe, Rayford Halsted, MD   I spent 35 minutes of face-to-face and non-face-to-face  time with patient.  This included previsit chart review, lab review, study review, order entry, electronic health record documentation, patient education and discussion regarding history of prior stroke and importance of managing stroke risk factors, at risk for sleep apnea and further evaluation and potential treatment options and answered all other questions to patient satisfaction  Frann Rider, [facility]  [facility] [address] [place], [place] [address]  Phone [phone] Fax [phone] Note: This document was prepared with digital dictation and possible smart phrase technology. Any transcriptional errors that result from this process are unintentional.

## 2020-08-08 NOTE — Patient Instructions (Addendum)
Continue aspirin 81 mg daily  and atorvastatin for secondary stroke prevention  Continue to follow up with PCP regarding cholesterol and blood pressure management  Maintain strict control of hypertension with blood pressure goal below 130/90 and cholesterol with LDL cholesterol (bad cholesterol) goal below 70 mg/dL.        Thank you for coming to see [place] at [facility]. I hope we have been able to provide you high quality care today.  You may receive a patient satisfaction survey over the next few weeks. We would appreciate your feedback and comments so that we may continue to improve ourselves and the health of our patients.     Sleep Apnea Sleep apnea affects breathing during sleep. It causes breathing to stop for a short time or to become shallow. It can also increase the risk of:  Heart attack.  Stroke.  Being very overweight (obese).  Diabetes.  Heart failure.  Irregular heartbeat. The goal of treatment is to help you breathe normally again. What are the causes? There are three kinds of sleep apnea:  Obstructive sleep apnea. This is caused by a blocked or collapsed airway.  Central sleep apnea. This happens when the brain does not send the right signals to the muscles that control breathing.  Mixed sleep apnea. This is a combination of obstructive and central sleep apnea. The most common cause of this condition is a collapsed or blocked airway. This can happen if:  Your throat muscles are too relaxed.  Your tongue and tonsils are too large.  You are overweight.  Your airway is too small.   What increases the risk?  Being overweight.  Smoking.  Having a small airway.  Being older.  Being male.  Drinking alcohol.  Taking medicines to calm yourself (sedatives or tranquilizers).  Having family members with the condition. What are the signs or symptoms?  Trouble staying asleep.  Being sleepy or tired during the day.  Getting  angry a lot.  Loud snoring.  Headaches in the morning.  Not being able to focus your mind (concentrate).  Forgetting things.  Less interest in sex.  Mood swings.  Personality changes.  Feelings of sadness (depression).  Waking up a lot during the night to pee (urinate).  Dry mouth.  Sore throat. How is this diagnosed?  Your medical history.  A physical exam.  A test that is done when you are sleeping (sleep study). The test is most often done in a sleep lab but may also be done at home. How is this treated?  Sleeping on your side.  Using a medicine to get rid of mucus in your nose (decongestant).  Avoiding the use of alcohol, medicines to help you relax, or certain pain medicines (narcotics).  Losing weight, if needed.  Changing your diet.  Not smoking.  Using a machine to open your airway while you sleep, such as: ? An oral appliance. This is a mouthpiece that shifts your lower jaw forward. ? A CPAP device. This device blows air through a mask when you breathe out (exhale). ? An EPAP device. This has valves that you put in each nostril. ? A BPAP device. This device blows air through a mask when you breathe in (inhale) and breathe out.  Having surgery if other treatments do not work. It is important to get treatment for sleep apnea. Without treatment, it can lead to:  High blood pressure.  Coronary artery disease.  In men, not being able to have an  erection (impotence).  Reduced thinking ability.   Follow these instructions at home: Lifestyle  Make changes that your doctor recommends.  Eat a healthy diet.  Lose weight if needed.  Avoid alcohol, medicines to help you relax, and some pain medicines.  Do not use any products that contain nicotine or tobacco, such as cigarettes, e-cigarettes, and chewing tobacco. If you need help quitting, ask your doctor. General instructions  Take over-the-counter and prescription medicines only as told by your  doctor.  If you were given a machine to use while you sleep, use it only as told by your doctor.  If you are having surgery, make sure to tell your doctor you have sleep apnea. You may need to bring your device with you.  Keep all follow-up visits as told by your doctor. This is important. Contact a doctor if:  The machine that you were given to use during sleep bothers you or does not seem to be working.  You do not get better.  You get worse. Get help right away if:  Your chest hurts.  You have trouble breathing in enough air.  You have an uncomfortable feeling in your back, arms, or stomach.  You have trouble talking.  One side of your body feels weak.  A part of your face is hanging down. These symptoms may be an emergency. Do not wait to see if the symptoms will go away. Get medical help right away. Call your local emergency services (911 in the [place] Do not drive yourself to the hospital. Summary  This condition affects breathing during sleep.  The most common cause is a collapsed or blocked airway.  The goal of treatment is to help you breathe normally while you sleep. This information is not intended to replace advice given to you by your health care provider. Make sure you discuss any questions you have with your health care provider. Document Revised: 03/18/2018 Document Reviewed: 01/25/2018 Elsevier Patient Education  [employer].

## 2020-08-13 NOTE — Progress Notes (Signed)
I agree with the above plan 

## 2020-10-04 ENCOUNTER — Other Ambulatory Visit: Payer: Self-pay

## 2020-10-04 ENCOUNTER — Other Ambulatory Visit (INDEPENDENT_AMBULATORY_CARE_PROVIDER_SITE_OTHER): Payer: Managed Care, Other (non HMO)

## 2020-10-04 DIAGNOSIS — E559 Vitamin D deficiency, unspecified: Secondary | ICD-10-CM | POA: Diagnosis not present

## 2020-10-04 LAB — VITAMIN D 25 HYDROXY (VIT D DEFICIENCY, FRACTURES): VITD: 27.59 ng/mL — ABNORMAL LOW (ref 30.00–100.00)

## 2020-10-08 ENCOUNTER — Other Ambulatory Visit: Payer: Self-pay | Admitting: Internal Medicine

## 2020-10-08 DIAGNOSIS — E559 Vitamin D deficiency, unspecified: Secondary | ICD-10-CM

## 2020-10-08 MED ORDER — VITAMIN D (ERGOCALCIFEROL) 1.25 MG (50000 UNIT) PO CAPS: 50000.0000 [IU] | ORAL_CAPSULE | ORAL | 0 refills | Status: AC

## 2020-11-29 ENCOUNTER — Ambulatory Visit (INDEPENDENT_AMBULATORY_CARE_PROVIDER_SITE_OTHER): Payer: Managed Care, Other (non HMO)

## 2020-11-29 ENCOUNTER — Other Ambulatory Visit: Payer: Self-pay

## 2020-11-29 DIAGNOSIS — Z23 Encounter for immunization: Secondary | ICD-10-CM

## 2020-11-29 NOTE — Progress Notes (Signed)
Per orders of Dr. Jerilee Hoh, injection of Shingrix vaccine given by Franco Collet. Patient tolerated injection well.

## 2020-11-29 NOTE — Patient Instructions (Signed)
Health Maintenance Due  Topic Date Due   Hepatitis C Screening  Never done   Zoster Vaccines- Shingrix (2 of 2) 08/23/2020   COVID-19 Vaccine (4 - Booster for Moderna series) 09/15/2020    Depression screen [facility] 2/9 06/28/2020 10/11/2019  Decreased Interest 0 0  Down, Depressed, Hopeless 0 0  PHQ - 2 Score 0 0  Altered sleeping 0 0  Tired, decreased energy - 0  Change in appetite 0 0  Feeling bad or failure about yourself  0 0  Trouble concentrating - 0  Moving slowly or fidgety/restless 0 0  Suicidal thoughts 0 0  PHQ-9 Score 0 0  Difficult doing work/chores - Not difficult at all

## 2021-01-10 ENCOUNTER — Encounter: Payer: Self-pay | Admitting: Internal Medicine

## 2021-01-10 ENCOUNTER — Ambulatory Visit (INDEPENDENT_AMBULATORY_CARE_PROVIDER_SITE_OTHER): Payer: Managed Care, Other (non HMO) | Admitting: Internal Medicine

## 2021-01-10 ENCOUNTER — Other Ambulatory Visit: Payer: Self-pay

## 2021-01-10 VITALS — BP 110/78 | HR 88 | Temp 98.0°F | Wt 234.2 lb

## 2021-01-10 DIAGNOSIS — E559 Vitamin D deficiency, unspecified: Secondary | ICD-10-CM

## 2021-01-10 DIAGNOSIS — E785 Hyperlipidemia, unspecified: Secondary | ICD-10-CM

## 2021-01-10 DIAGNOSIS — Z1159 Encounter for screening for other viral diseases: Secondary | ICD-10-CM

## 2021-01-10 DIAGNOSIS — I10 Essential (primary) hypertension: Secondary | ICD-10-CM

## 2021-01-10 MED ORDER — ATORVASTATIN CALCIUM 40 MG PO TABS: 40.0000 mg | ORAL_TABLET | Freq: Every day | ORAL | 2 refills | Status: DC

## 2021-01-10 MED ORDER — AMLODIPINE BESYLATE 5 MG PO TABS
5.0000 mg | ORAL_TABLET | Freq: Every day | ORAL | 2 refills | Status: DC
Start: 2021-01-10 — End: 2021-08-21

## 2021-01-10 NOTE — Progress Notes (Signed)
Established Patient Office Visit     This visit occurred during the SARS-CoV-2 public health emergency.  Safety protocols were in place, including screening questions prior to the visit, additional usage of staff PPE, and extensive cleaning of exam room while observing appropriate contact time as indicated for disinfecting solutions.    CC/Reason for Visit: 68-monthfollow-up chronic medical conditions  HPI: MKOLETON ARCIAis a 55y.o. male who is coming in today for the above mentioned reasons. Past Medical History is significant for: Hypertension, hyperlipidemia, vitamin D deficiency.  He also had a left pontine CVA in April 2021.  He is requesting refills of his medications.  He would like to have his hep C screening.  He is a low risk patient.  He tells me that he has a sleep study that was scheduled by his neurologist.  He has no acute concerns or complaints today.   Past Medical/Surgical History: Past Medical History:  Diagnosis Date   Constipation    CVA (cerebral vascular accident) ([facility]    09/2019   Hyperlipidemia    Hypertension    Obesity (BMI 30.0-34.9)    Prostatitis, chronic     Past Surgical History:  Procedure Laterality Date   HERNIA REPAIR     age 55- ?ingunial x2    Social History:  reports that he has never smoked. He has never used smokeless tobacco. He reports current alcohol use. He reports that he does not use drugs.  Allergies: No Known Allergies  Family History:  Family History  Problem Relation Age of Onset   Arthritis Mother    Heart disease Mother    Hyperlipidemia Mother    Hypertension Mother    Dementia Mother    Heart disease Father        632 post-rheumatic fever valve disease   Colon cancer Neg Hx    Colon polyps Neg Hx    Esophageal cancer Neg Hx    Rectal cancer Neg Hx    Stomach cancer Neg Hx    Stroke Neg Hx      Current Outpatient Medications:    aspirin EC 81 MG tablet, Take 1 tablet (81 mg total) by mouth  daily., Disp: 30 tablet, Rfl: 0   amLODipine (NORVASC) 5 MG tablet, Take 1 tablet (5 mg total) by mouth daily., Disp: 90 tablet, Rfl: 2   atorvastatin (LIPITOR) 40 MG tablet, Take 1 tablet (40 mg total) by mouth daily., Disp: 90 tablet, Rfl: 2  Review of Systems:  Constitutional: Denies fever, chills, diaphoresis, appetite change and fatigue.  HEENT: Denies photophobia, eye pain, redness, hearing loss, ear pain, congestion, sore throat, rhinorrhea, sneezing, mouth sores, trouble swallowing, neck pain, neck stiffness and tinnitus.   Respiratory: Denies SOB, DOE, cough, chest tightness,  and wheezing.   Cardiovascular: Denies chest pain, palpitations and leg swelling.  Gastrointestinal: Denies nausea, vomiting, abdominal pain, diarrhea, constipation, blood in stool and abdominal distention.  Genitourinary: Denies dysuria, urgency, frequency, hematuria, flank pain and difficulty urinating.  Endocrine: Denies: hot or cold intolerance, sweats, changes in hair or nails, polyuria, polydipsia. Musculoskeletal: Denies myalgias, back pain, joint swelling, arthralgias and gait problem.  Skin: Denies pallor, rash and wound.  Neurological: Denies dizziness, seizures, syncope, weakness, light-headedness, numbness and headaches.  Hematological: Denies adenopathy. Easy bruising, personal or family bleeding history  Psychiatric/Behavioral: Denies suicidal ideation, mood changes, confusion, nervousness, sleep disturbance and agitation    Physical Exam: Vitals:   01/10/21 1519  BP: 110/78  Pulse: 88  Temp: 98 F (36.7 C)  TempSrc: Oral  SpO2: 95%  Weight: 234 lb 3.2 oz (106.2 kg)    Body mass index is 32.66 kg/m.   Constitutional: NAD, calm, comfortable Eyes: PERRL, lids and conjunctivae normal ENMT: Mucous membranes are moist.  Respiratory: clear to auscultation bilaterally, no wheezing, no crackles. Normal respiratory effort. No accessory muscle use.  Cardiovascular: Regular rate and rhythm,  no murmurs / rubs / gallops. No extremity edema.  Neurologic: Grossly intact and nonfocal Psychiatric: Normal judgment and insight. Alert and oriented x 3. Normal mood.    Impression and Plan:  Essential hypertension  - Plan: amLODipine (NORVASC) 5 MG tablet -Blood pressures well controlled today.  Hyperlipidemia, unspecified hyperlipidemia type  - Plan: atorvastatin (LIPITOR) 40 MG tablet -Last lipid panel in January 2022 ordered total cholesterol 101, triglycerides 74 and LDL 50.  Vitamin D deficiency  - Plan: VITAMIN D 25 Hydroxy (Vit-D Deficiency, Fractures)  Encounter for hepatitis C screening test for low risk patient  - Plan: Hep C Antibody  Time spent: 30 minutes reviewing chart, interviewing and examining patient and formulating plan of care.     Lelon Frohlich, MD [facility]

## 2021-01-10 NOTE — Addendum Note (Signed)
Addended by: Amanda Cockayne on: 01/10/2021 03:56 PM   Modules accepted: Orders

## 2021-01-10 NOTE — Addendum Note (Signed)
Addended by: Amanda Cockayne on: 01/10/2021 03:57 PM   Modules accepted: Orders

## 2021-01-13 LAB — VITAMIN D 25 HYDROXY (VIT D DEFICIENCY, FRACTURES): Vit D, 25-Hydroxy: 53 ng/mL (ref 30–100)

## 2021-01-13 LAB — HEPATITIS C ANTIBODY
Hepatitis C Ab: NONREACTIVE
SIGNAL TO CUT-OFF: 0.01 (ref <1.00)

## 2021-01-13 LAB — EXTRA LAV TOP TUBE

## 2021-04-07 ENCOUNTER — Other Ambulatory Visit: Payer: Self-pay

## 2021-04-07 ENCOUNTER — Ambulatory Visit (INDEPENDENT_AMBULATORY_CARE_PROVIDER_SITE_OTHER): Payer: Managed Care, Other (non HMO) | Admitting: Family Medicine

## 2021-04-07 VITALS — BP 142/72 | HR 53 | Temp 97.6°F | Wt 232.4 lb

## 2021-04-07 DIAGNOSIS — M5412 Radiculopathy, cervical region: Secondary | ICD-10-CM

## 2021-04-07 MED ORDER — METHYLPREDNISOLONE 4 MG PO TBPK: ORAL_TABLET | ORAL | 0 refills | Status: DC

## 2021-04-07 MED ORDER — TRAMADOL HCL 50 MG PO TABS
50.0000 mg | ORAL_TABLET | Freq: Four times a day (QID) | ORAL | 0 refills | Status: DC | PRN
Start: 2021-04-07 — End: 2021-04-09

## 2021-04-07 NOTE — Progress Notes (Signed)
Established Patient Office Visit  Subjective:  Patient ID: Dylan Fuller, male    DOB: Oct 29, 1965  Age: 55 y.o. MRN: [id number]  CC:  Chief Complaint  Patient presents with   Shoulder Pain    X 1 day, pain 10/10, unable to really move the arm, hand and fingers feel numb, thought this was a cramp in the neck but thinks that it might be a pinched nerve now. No known injury    HPI Dylan Fuller presents for pain left upper extremity neck and shoulder region for the past day.  He basically woke up Sunday with severe sharp pain radiating from the upper back area to the shoulder all the way down to the hand.  He had some numbness involving the thumb and index finger.  Has noticed some weakness with grip.  No recent injury.  No history of cervical problems previously.  Pain at times is very intense 10 out of 10.  Some relief with holding his left arm up to the body.  He is taken some ibuprofen and Tylenol without much improvement.  He is right-hand dominant.  Works as a pharmacist.  Past Medical History:  Diagnosis Date   Constipation    CVA (cerebral vascular accident) (HCC)    09/2019   Hyperlipidemia    Hypertension    Obesity (BMI 30.0-34.9)    Prostatitis, chronic     Past Surgical History:  Procedure Laterality Date   HERNIA REPAIR     age 1 - ?ingunial x2    Family History  Problem Relation Age of Onset   Arthritis Mother    Heart disease Mother    Hyperlipidemia Mother    Hypertension Mother    Dementia Mother    Heart disease Father        61 ; post-rheumatic fever valve disease   Colon cancer Neg Hx    Colon polyps Neg Hx    Esophageal cancer Neg Hx    Rectal cancer Neg Hx    Stomach cancer Neg Hx    Stroke Neg Hx     Social History   Socioeconomic History   Marital status: Married    Spouse name: Not on file   Number of children: Not on file   Years of education: Not on file   Highest education level: Not on file  Occupational History   Occupation:  pharmacist  Tobacco Use   Smoking status: Never   Smokeless tobacco: Never   Tobacco comments:    pt states he has smoked a few cigarettes in his lifetime only   Substance and Sexual Activity   Alcohol use: Yes    Comment: social   Drug use: No   Sexual activity: Not on file  Other Topics Concern   Not on file  Social History Narrative   Not on file   Social Determinants of Health   Financial Resource Strain: Not on file  Food Insecurity: Not on file  Transportation Needs: Not on file  Physical Activity: Not on file  Stress: Not on file  Social Connections: Not on file  Intimate Partner Violence: Not on file    Outpatient Medications Prior to Visit  Medication Sig Dispense Refill   amLODipine (NORVASC) 5 MG tablet Take 1 tablet (5 mg total) by mouth daily. 90 tablet 2   aspirin EC 81 MG tablet Take 1 tablet (81 mg total) by mouth daily. 30 tablet 0   atorvastatin (LIPITOR) 40 MG tablet Take 1 tablet (  40 mg total) by mouth daily. 90 tablet 2   No facility-administered medications prior to visit.    No Known Allergies  ROS Review of Systems  Constitutional:  Negative for chills and fever.  Cardiovascular:  Negative for chest pain.  Musculoskeletal:  Positive for neck pain.  Neurological:  Positive for weakness and numbness.     Objective:    Physical Exam Vitals reviewed.  Constitutional:      Appearance: Normal appearance.  Cardiovascular:     Rate and Rhythm: Normal rate and regular rhythm.  Pulmonary:     Effort: Pulmonary effort is normal.     Breath sounds: Normal breath sounds.  Musculoskeletal:     Cervical back: Neck supple.     Comments: Does have some increased left shoulder left upper extremity pain with lateral bending to the right  Good range of motion left shoulder.  No localized tenderness left shoulder.  Neurological:     Mental Status: He is alert.     Comments: No upper extremity muscle atrophy.  He has fairly good strength throughout.   Does have slightly diminished biceps and brachioradialis reflex left compared to the right    BP (!) 142/72 (BP Location: Left Arm, Patient Position: Sitting, Cuff Size: Normal)   Pulse (!) 53   Temp 97.6 F (36.4 C) (Oral)   Wt 232 lb 6.4 oz (105.4 kg)   SpO2 98%   BMI 32.41 kg/m  Wt Readings from Last 3 Encounters:  04/07/21 232 lb 6.4 oz (105.4 kg)  01/10/21 234 lb 3.2 oz (106.2 kg)  08/08/20 241 lb (109.3 kg)     Health Maintenance Due  Topic Date Due   COVID-19 Vaccine (4 - Booster for Moderna series) 07/12/2020   INFLUENZA VACCINE  01/13/2021    There are no preventive care reminders to display for this patient.  Lab Results  Component Value Date   TSH 1.08 06/28/2020   Lab Results  Component Value Date   WBC 6.9 06/28/2020   HGB 15.8 06/28/2020   HCT 46.8 06/28/2020   MCV 87.3 06/28/2020   PLT 253.0 06/28/2020   Lab Results  Component Value Date   NA 138 06/28/2020   K 4.2 06/28/2020   CO2 27 06/28/2020   GLUCOSE 103 (H) 06/28/2020   BUN 13 06/28/2020   CREATININE 0.78 06/28/2020   BILITOT 0.9 06/28/2020   ALKPHOS 87 06/28/2020   AST 24 06/28/2020   ALT 44 06/28/2020   PROT 7.0 06/28/2020   ALBUMIN 4.8 06/28/2020   CALCIUM 9.5 06/28/2020   ANIONGAP 13 09/27/2019   GFR 99.83 06/28/2020   Lab Results  Component Value Date   CHOL 101 06/28/2020   Lab Results  Component Value Date   HDL 37.00 (L) 06/28/2020   Lab Results  Component Value Date   LDLCALC 50 06/28/2020   Lab Results  Component Value Date   TRIG 74.0 06/28/2020   Lab Results  Component Value Date   CHOLHDL 3 06/28/2020   Lab Results  Component Value Date   HGBA1C 5.4 06/28/2020      Assessment & Plan:   Patient presents with acute cervical radiculitis symptoms with complaints of numbness involving the left index and thumb digits and also has some left upper extremity weakness and reduction of reflex especially left biceps. ?C5-6 disc herniation.    -Medrol  Dosepak -Wrote for limited tramadol 50 mg 1 every 6 hours as needed for severe pain #20 with no refill -Touch base  by end of week.  If no improvement in pain and especially any progressive neurologic symptoms may need to push for MRI sooner.  Meds ordered this encounter  Medications   methylPREDNISolone (MEDROL DOSEPAK) 4 MG TBPK tablet    Sig: Use as directed.    Dispense:  21 tablet    Refill:  0   traMADol (ULTRAM) 50 MG tablet    Sig: Take 1 tablet (50 mg total) by mouth every 6 (six) hours as needed for up to 5 days.    Dispense:  20 tablet    Refill:  0    Follow-up: No follow-ups on file.    Carolann Littler, MD

## 2021-04-09 ENCOUNTER — Encounter: Payer: Self-pay | Admitting: Family Medicine

## 2021-04-09 DIAGNOSIS — M5412 Radiculopathy, cervical region: Secondary | ICD-10-CM

## 2021-04-09 MED ORDER — TRAMADOL HCL 50 MG PO TABS: 50.0000 mg | ORAL_TABLET | Freq: Four times a day (QID) | ORAL | 0 refills | Status: AC | PRN

## 2021-04-09 NOTE — Telephone Encounter (Signed)
Let patient know MRI has been scheduled and refill of tramadol sent

## 2021-04-15 MED ORDER — PREDNISONE 10 MG PO TABS: 10.0000 mg | ORAL_TABLET | Freq: Every day | ORAL | 0 refills | Status: DC

## 2021-04-15 NOTE — Telephone Encounter (Signed)
Patient called asking if he could speak to Dr. Ledell Noss CMA. He says that he is having a little miscommunication through the messages in MyChart and is wanting to speak to Dr.Hernandez's CMA for clarification.  Patient would like a call at [phone].  Please advise.

## 2021-04-15 NOTE — Addendum Note (Signed)
Addended by: Rebecca Eaton on: 04/15/2021 01:06 PM   Modules accepted: Orders

## 2021-04-15 NOTE — Telephone Encounter (Signed)
Spoke with patient he requests:   The prescription of Tramadol will only last until Thursday.  He is taking it every 6 hours and will be out by Thursday.  His MRI is Saturday and he will need enough to last until the results come in. Patient did not pick up the prescription of prednisone that Dr Elease Hashimoto sent in today.       He would like Prednisone DS 10 mg 12 day taper.      Which would equal 48 tablets.  Dylan Fuller [employer] [address].

## 2021-04-15 NOTE — Telephone Encounter (Signed)
Please advise 

## 2021-04-16 ENCOUNTER — Encounter: Payer: Self-pay | Admitting: Internal Medicine

## 2021-04-16 ENCOUNTER — Other Ambulatory Visit: Payer: Self-pay | Admitting: Internal Medicine

## 2021-04-16 DIAGNOSIS — M5412 Radiculopathy, cervical region: Secondary | ICD-10-CM

## 2021-04-16 MED ORDER — TRAMADOL HCL 50 MG PO TABS: 50.0000 mg | ORAL_TABLET | Freq: Four times a day (QID) | ORAL | 0 refills | Status: AC | PRN

## 2021-04-16 MED ORDER — PREDNISONE 10 MG (21) PO TBPK: ORAL_TABLET | ORAL | 0 refills | Status: DC

## 2021-04-19 ENCOUNTER — Other Ambulatory Visit: Payer: Managed Care, Other (non HMO)

## 2021-04-21 ENCOUNTER — Telehealth: Payer: Self-pay | Admitting: Internal Medicine

## 2021-04-21 NOTE — Telephone Encounter (Signed)
Sarah from [employer] called to get the last OV date and any future OV dates.     Good callback number is [phone]   Claim number is [id number]

## 2021-04-22 ENCOUNTER — Telehealth: Payer: Self-pay | Admitting: Internal Medicine

## 2021-04-22 NOTE — Telephone Encounter (Signed)
Wells Guiles (spouse) brought in paperwork that she needs Dr.Hernandez to complete for patient. Paperwork would be placed in the folder.   Patient would like Dr.Hernandez to call him before completing the paperwork to ensure that it's correct.  Patient could be contacted at [phone] when paperwork is complete.  Please advise.

## 2021-04-23 NOTE — Telephone Encounter (Signed)
Placed in Dr Hernandez's folder 

## 2021-04-23 NOTE — Telephone Encounter (Signed)
Patient should sign a ROI.

## 2021-04-24 NOTE — Telephone Encounter (Signed)
Patient has an appointment 04/25/21. Will discuss form then.

## 2021-04-25 ENCOUNTER — Other Ambulatory Visit: Payer: Self-pay

## 2021-04-25 ENCOUNTER — Encounter: Payer: Self-pay | Admitting: Internal Medicine

## 2021-04-25 ENCOUNTER — Ambulatory Visit (INDEPENDENT_AMBULATORY_CARE_PROVIDER_SITE_OTHER): Payer: Managed Care, Other (non HMO) | Admitting: Internal Medicine

## 2021-04-25 VITALS — BP 120/88 | HR 95 | Temp 98.1°F | Wt 237.9 lb

## 2021-04-25 DIAGNOSIS — M5412 Radiculopathy, cervical region: Secondary | ICD-10-CM | POA: Diagnosis not present

## 2021-04-25 NOTE — Progress Notes (Signed)
Established Patient Office Visit     This visit occurred during the SARS-CoV-2 public health emergency.  Safety protocols were in place, including screening questions prior to the visit, additional usage of staff PPE, and extensive cleaning of exam room while observing appropriate contact time as indicated for disinfecting solutions.    CC/Reason for Visit: Cervical radiculopathy  HPI: Dylan Fuller is a 55 y.o. male who is coming in today for the above mentioned reasons.  He has scheduled this visit to update me on his current issues and to fill out forms for FMLA.  On 10/23 he started developing left neck pain with left arm numbness and tingling.  He saw another provider in the office, was given tramadol and a Medrol Dosepak which helped.  However as soon as the Medrol Dosepak was completed he continued to have pain.  This was refilled for him.  An MRI of the cervical spine was scheduled.  Unfortunately insurance denied coverage unless he had completed 6 weeks of conservative therapy.  He is now having some mild weakness of the hand, he feels like he is dropping things out of his left arm.  Past Medical/Surgical History: Past Medical History:  Diagnosis Date   Constipation    CVA (cerebral vascular accident) ([place])    09/2019   Hyperlipidemia    Hypertension    Obesity (BMI 30.0-34.9)    Prostatitis, chronic     Past Surgical History:  Procedure Laterality Date   HERNIA REPAIR     age 56 - ?ingunial x2    Social History:  reports that he has never smoked. He has never used smokeless tobacco. He reports current alcohol use. He reports that he does not use drugs.  Allergies: No Known Allergies  Family History:  Family History  Problem Relation Age of Onset   Arthritis Mother    Heart disease Mother    Hyperlipidemia Mother    Hypertension Mother    Dementia Mother    Heart disease Father        52; post-rheumatic fever valve disease   Colon cancer Neg Hx    Colon  polyps Neg Hx    Esophageal cancer Neg Hx    Rectal cancer Neg Hx    Stomach cancer Neg Hx    Stroke Neg Hx      Current Outpatient Medications:    amLODipine (NORVASC) 5 MG tablet, Take 1 tablet (5 mg total) by mouth daily., Disp: 90 tablet, Rfl: 2   aspirin EC 81 MG tablet, Take 1 tablet (81 mg total) by mouth daily., Disp: 30 tablet, Rfl: 0   atorvastatin (LIPITOR) 40 MG tablet, Take 1 tablet (40 mg total) by mouth daily., Disp: 90 tablet, Rfl: 2  Review of Systems:  Constitutional: Denies fever, chills, diaphoresis, appetite change and fatigue.  HEENT: Denies photophobia, eye pain, redness, hearing loss, ear pain, congestion, sore throat, rhinorrhea, sneezing, mouth sores, trouble swallowing, neck pain, neck stiffness and tinnitus.   Respiratory: Denies SOB, DOE, cough, chest tightness,  and wheezing.   Cardiovascular: Denies chest pain, palpitations and leg swelling.  Gastrointestinal: Denies nausea, vomiting, abdominal pain, diarrhea, constipation, blood in stool and abdominal distention.  Genitourinary: Denies dysuria, urgency, frequency, hematuria, flank pain and difficulty urinating.  Endocrine: Denies: hot or cold intolerance, sweats, changes in hair or nails, polyuria, polydipsia. Musculoskeletal: Denies myalgias, back pain, joint swelling, arthralgias and gait problem.  Skin: Denies pallor, rash and wound.  Neurological: Denies dizziness, seizures, syncope,  and headaches.  Hematological: Denies adenopathy. Easy bruising, personal or family bleeding history  Psychiatric/Behavioral: Denies suicidal ideation, mood changes, confusion, nervousness, sleep disturbance and agitation    Physical Exam: Vitals:   04/25/21 1457  BP: 120/88  Pulse: 95  Temp: 98.1 F (36.7 C)  TempSrc: Oral  SpO2: 98%  Weight: 237 lb 14.4 oz (107.9 kg)    Body mass index is 33.18 kg/m.   Constitutional: NAD, calm, comfortable Eyes: PERRL, lids and conjunctivae normal ENMT: Mucous  membranes are moist.  Psychiatric: Normal judgment and insight. Alert and oriented x 3. Normal mood.    Impression and Plan:  Cervical radiculopathy -FMLA forms have been filled out. -Since his radiculopathy symptoms continue and he is now developing weakness I think it is imperative that we do MRI now.  He will contact us back when he has the phone number of who we need to contact from his insurance company to get this taken care of.  Time spent: 22 minutes reviewing chart, interviewing and examining patient and formulating plan of care, filling out forms.     Lelon Frohlich, MD [facility]

## 2021-04-29 ENCOUNTER — Encounter: Payer: Self-pay | Admitting: Internal Medicine

## 2021-04-30 ENCOUNTER — Telehealth: Payer: Self-pay | Admitting: Internal Medicine

## 2021-04-30 NOTE — Telephone Encounter (Signed)
[employer] for Duty Certification form to be filled out--placed in dr's folder.  Please call [phone] Elta Guadel[place]) or [phone] Wells Guiles) upon completion.

## 2021-05-01 NOTE — Telephone Encounter (Signed)
Form shredded.

## 2021-05-02 NOTE — Telephone Encounter (Signed)
Deb spoke with patient

## 2021-05-02 NOTE — Telephone Encounter (Signed)
Form was discarded per patient request.  See mychart message

## 2021-05-13 ENCOUNTER — Other Ambulatory Visit: Payer: Self-pay | Admitting: Internal Medicine

## 2021-05-13 DIAGNOSIS — M5412 Radiculopathy, cervical region: Secondary | ICD-10-CM

## 2021-05-13 MED ORDER — PREDNISONE 10 MG (21) PO TBPK: ORAL_TABLET | ORAL | 0 refills | Status: DC

## 2021-05-15 ENCOUNTER — Institutional Professional Consult (permissible substitution): Payer: Managed Care, Other (non HMO) | Admitting: Neurology

## 2021-07-10 ENCOUNTER — Ambulatory Visit: Payer: Managed Care, Other (non HMO) | Admitting: Internal Medicine

## 2021-08-08 ENCOUNTER — Other Ambulatory Visit: Payer: Self-pay

## 2021-08-08 ENCOUNTER — Ambulatory Visit
Admission: RE | Admit: 2021-08-08 | Discharge: 2021-08-08 | Disposition: A | Discharge status: Home / Self Care | Payer: Managed Care, Other (non HMO) | Source: Ambulatory Visit | Attending: Family Medicine | Admitting: Family Medicine

## 2021-08-08 DIAGNOSIS — M5412 Radiculopathy, cervical region: Secondary | ICD-10-CM

## 2021-08-21 ENCOUNTER — Ambulatory Visit (INDEPENDENT_AMBULATORY_CARE_PROVIDER_SITE_OTHER): Payer: Managed Care, Other (non HMO) | Admitting: Internal Medicine

## 2021-08-21 VITALS — BP 120/84 | HR 88 | Temp 97.5°F | Wt 241.5 lb

## 2021-08-21 DIAGNOSIS — I10 Essential (primary) hypertension: Secondary | ICD-10-CM

## 2021-08-21 DIAGNOSIS — M5412 Radiculopathy, cervical region: Secondary | ICD-10-CM

## 2021-08-21 DIAGNOSIS — E785 Hyperlipidemia, unspecified: Secondary | ICD-10-CM

## 2021-08-21 MED ORDER — ATORVASTATIN CALCIUM 40 MG PO TABS: 40.0000 mg | ORAL_TABLET | Freq: Every day | ORAL | 0 refills | Status: DC

## 2021-08-21 MED ORDER — AMLODIPINE BESYLATE 5 MG PO TABS: 5.0000 mg | ORAL_TABLET | Freq: Every day | ORAL | 0 refills | Status: DC

## 2021-08-21 NOTE — Progress Notes (Signed)
? ? ? ?Established Patient Office Visit ? ? ? ? ?This visit occurred during the SARS-CoV-2 public health emergency.  Safety protocols were in place, including screening questions prior to the visit, additional usage of staff PPE, and extensive cleaning of exam room while observing appropriate contact time as indicated for disinfecting solutions.  ? ? ?CC/Reason for Visit: Follow-up chronic medical conditions ? ?HPI: Dylan Fuller is a 56 y.o. male who is coming in today for the above mentioned reasons. Past Medical History is significant for: Hypertension, hyperlipidemia, he suffered a CVA in April 2021.  He has been dealing with left arm weakness and numbness.  He had an MRI with results as below.  He is requesting neurosurgical referral. ? ?IMPRESSION: ?1. Multilevel spondylosis of the cervical spine as described. ?2. Mild left foraminal narrowing at C2-3. ?3. Mild bilateral foraminal narrowing at C3-4. ?4. Moderate foraminal narrowing bilaterally at C4-5 is right greater ?than left. ?5. Moderate foraminal narrowing bilaterally at C5-6. ?6. Moderate left and mild right foraminal narrowing at C6-7. ? ? ?Past Medical/Surgical History: ?Past Medical History:  ?Diagnosis Date  ? Constipation   ? CVA (cerebral vascular accident) [facility])   ? 09/2019  ? Hyperlipidemia   ? Hypertension   ? Obesity (BMI 30.0-34.9)   ? Prostatitis, chronic   ? ? ?Past Surgical History:  ?Procedure Laterality Date  ? HERNIA REPAIR    ? age 21 - ?ingunial x2  ? ? ?Social History: ? reports that he has never smoked. He has never used smokeless tobacco. He reports current alcohol use. He reports that he does not use drugs. ? ?Allergies: ?No Known Allergies ? ?Family History:  ?Family History  ?Problem Relation Age of Onset  ? Arthritis Mother   ? Heart disease Mother   ? Hyperlipidemia Mother   ? Hypertension Mother   ? Dementia Mother   ? Heart disease Father   ?     70; post-rheumatic fever valve disease  ? Colon cancer Neg Hx   ? Colon polyps  Neg Hx   ? Esophageal cancer Neg Hx   ? Rectal cancer Neg Hx   ? Stomach cancer Neg Hx   ? Stroke Neg Hx   ? ? ? ?Current Outpatient Medications:  ?  aspirin EC 81 MG tablet, Take 1 tablet (81 mg total) by mouth daily., Disp: 30 tablet, Rfl: 0 ?  predniSONE (STERAPRED UNI-PAK 21 TAB) 10 MG (21) TBPK tablet, Take as directed, Disp: 21 tablet, Rfl: 0 ?  amLODipine (NORVASC) 5 MG tablet, Take 1 tablet (5 mg total) by mouth daily., Disp: 90 tablet, Rfl: 0 ?  atorvastatin (LIPITOR) 40 MG tablet, Take 1 tablet (40 mg total) by mouth daily., Disp: 90 tablet, Rfl: 0 ? ?Review of Systems:  ?Constitutional: Denies fever, chills, diaphoresis, appetite change and fatigue.  ?HEENT: Denies photophobia, eye pain, redness, hearing loss, ear pain, congestion, sore throat, rhinorrhea, sneezing, mouth sores, trouble swallowing, neck pain, neck stiffness and tinnitus.   ?Respiratory: Denies SOB, DOE, cough, chest tightness,  and wheezing.   ?Cardiovascular: Denies chest pain, palpitations and leg swelling.  ?Gastrointestinal: Denies nausea, vomiting, abdominal pain, diarrhea, constipation, blood in stool and abdominal distention.  ?Genitourinary: Denies dysuria, urgency, frequency, hematuria, flank pain and difficulty urinating.  ?Endocrine: Denies: hot or cold intolerance, sweats, changes in hair or nails, polyuria, polydipsia. ?Musculoskeletal: Denies myalgias, back pain, joint swelling, arthralgias and gait problem.  ?Skin: Denies pallor, rash and wound.  ?Neurological: Denies dizziness, seizures, syncope, weakness,  light-headedness, numbness and headaches.  ?Hematological: Denies adenopathy. Easy bruising, personal or family bleeding history  ?Psychiatric/Behavioral: Denies suicidal ideation, mood changes, confusion, nervousness, sleep disturbance and agitation ? ? ? ?Physical Exam: ?Vitals:  ? 08/21/21 0925  ?BP: 120/84  ?Pulse: 88  ?Temp: (!) 97.5 ?F (36.4 ?C)  ?TempSrc: Oral  ?SpO2: 99%  ?Weight: 241 lb 8 oz (109.5 kg)   ? ? ?Body mass index is 33.68 kg/m?. ? ? ?Constitutional: NAD, calm, comfortable ?Eyes: PERRL, lids and conjunctivae normal ?ENMT: Mucous membranes are moist.  ?Respiratory: clear to auscultation bilaterally, no wheezing, no crackles. Normal respiratory effort. No accessory muscle use.  ?Cardiovascular: Regular rate and rhythm, no murmurs / rubs / gallops. No extremity edema.  ?Psychiatric: Normal judgment and insight. Alert and oriented x 3. Normal mood.  ? ? ?Impression and Plan: ? ?Essential hypertension  ?- Plan: amLODipine (NORVASC) 5 MG tablet ?-Well-controlled. ? ?Hyperlipidemia, unspecified hyperlipidemia type  ?- Plan: atorvastatin (LIPITOR) 40 MG tablet ?-Last lipid panel in January 2022 with a total cholesterol of 101, LDL 50 and triglycerides 74. ? ?Cervical radiculopathy  ?- Plan: Ambulatory referral to Neurosurgery ? ?Time spent: 30 minutes reviewing chart, interviewing and examining patient and formulating plan of care. ? ? ? ?Lelon Frohlich, MD ?[facility] at [facility] ? ? ?

## 2021-11-30 ENCOUNTER — Emergency Department (HOSPITAL_COMMUNITY): Payer: Managed Care, Other (non HMO)

## 2021-11-30 ENCOUNTER — Encounter (HOSPITAL_BASED_OUTPATIENT_CLINIC_OR_DEPARTMENT_OTHER): Payer: Self-pay

## 2021-11-30 ENCOUNTER — Inpatient Hospital Stay (HOSPITAL_BASED_OUTPATIENT_CLINIC_OR_DEPARTMENT_OTHER)
Admission: EM | Admit: 2021-11-30 | Discharge: 2021-12-03 | DRG: 066 | Disposition: A | Discharge status: Home / Self Care | Payer: Managed Care, Other (non HMO) | Attending: Family Medicine | Admitting: Family Medicine

## 2021-11-30 ENCOUNTER — Other Ambulatory Visit: Payer: Self-pay

## 2021-11-30 DIAGNOSIS — E669 Obesity, unspecified: Secondary | ICD-10-CM | POA: Diagnosis present

## 2021-11-30 DIAGNOSIS — R29810 Facial weakness: Principal | ICD-10-CM

## 2021-11-30 DIAGNOSIS — I69392 Facial weakness following cerebral infarction: Secondary | ICD-10-CM

## 2021-11-30 DIAGNOSIS — Z6832 Body mass index (BMI) 32.0-32.9, adult: Secondary | ICD-10-CM

## 2021-11-30 DIAGNOSIS — E876 Hypokalemia: Secondary | ICD-10-CM | POA: Diagnosis not present

## 2021-11-30 DIAGNOSIS — Z79899 Other long term (current) drug therapy: Secondary | ICD-10-CM

## 2021-11-30 DIAGNOSIS — G4733 Obstructive sleep apnea (adult) (pediatric): Secondary | ICD-10-CM | POA: Diagnosis present

## 2021-11-30 DIAGNOSIS — E785 Hyperlipidemia, unspecified: Secondary | ICD-10-CM | POA: Diagnosis present

## 2021-11-30 DIAGNOSIS — I6381 Other cerebral infarction due to occlusion or stenosis of small artery: Principal | ICD-10-CM | POA: Diagnosis present

## 2021-11-30 DIAGNOSIS — R29701 NIHSS score 1: Secondary | ICD-10-CM | POA: Diagnosis present

## 2021-11-30 DIAGNOSIS — Z83438 Family history of other disorder of lipoprotein metabolism and other lipidemia: Secondary | ICD-10-CM

## 2021-11-30 DIAGNOSIS — Z8249 Family history of ischemic heart disease and other diseases of the circulatory system: Secondary | ICD-10-CM

## 2021-11-30 DIAGNOSIS — N411 Chronic prostatitis: Secondary | ICD-10-CM | POA: Diagnosis present

## 2021-11-30 DIAGNOSIS — I639 Cerebral infarction, unspecified: Secondary | ICD-10-CM

## 2021-11-30 DIAGNOSIS — I10 Essential (primary) hypertension: Secondary | ICD-10-CM | POA: Diagnosis present

## 2021-11-30 DIAGNOSIS — Z7982 Long term (current) use of aspirin: Secondary | ICD-10-CM

## 2021-11-30 LAB — DIFFERENTIAL
Abs Immature Granulocytes: 0.08 10*3/uL — ABNORMAL HIGH (ref 0.00–0.07)
Basophils Absolute: 0 10*3/uL (ref 0.0–0.1)
Basophils Relative: 1 %
Eosinophils Absolute: 0.4 10*3/uL (ref 0.0–0.5)
Eosinophils Relative: 5 %
Immature Granulocytes: 1 %
Lymphocytes Relative: 33 %
Lymphs Abs: 2.5 10*3/uL (ref 0.7–4.0)
Monocytes Absolute: 0.5 10*3/uL (ref 0.1–1.0)
Monocytes Relative: 7 %
Neutro Abs: 4.2 10*3/uL (ref 1.7–7.7)
Neutrophils Relative %: 53 %

## 2021-11-30 LAB — COMPREHENSIVE METABOLIC PANEL
ALT: 46 U/L — ABNORMAL HIGH (ref 0–44)
AST: 24 U/L (ref 15–41)
Albumin: 4.7 g/dL (ref 3.5–5.0)
Alkaline Phosphatase: 63 U/L (ref 38–126)
Anion gap: 10 (ref 5–15)
BUN: 12 mg/dL (ref 6–20)
CO2: 25 mmol/L (ref 22–32)
Calcium: 9.9 mg/dL (ref 8.9–10.3)
Chloride: 104 mmol/L (ref 98–111)
Creatinine, Ser: 0.84 mg/dL (ref 0.61–1.24)
GFR, Estimated: >60 mL/min (ref >60)
Glucose, Bld: 109 mg/dL — ABNORMAL HIGH (ref 70–99)
Potassium: 3.7 mmol/L (ref 3.5–5.1)
Sodium: 139 mmol/L (ref 135–145)
Total Bilirubin: 0.7 mg/dL (ref 0.3–1.2)
Total Protein: 7.4 g/dL (ref 6.5–8.1)

## 2021-11-30 LAB — CBC
HCT: 44.4 % (ref 39.0–52.0)
Hemoglobin: 15.6 g/dL (ref 13.0–17.0)
MCH: 29.8 pg (ref 26.0–34.0)
MCHC: 35.1 g/dL (ref 30.0–36.0)
MCV: 84.7 fL (ref 80.0–100.0)
Platelets: 251 10*3/uL (ref 150–400)
RBC: 5.24 MIL/uL (ref 4.22–5.81)
RDW: 13.1 % (ref 11.5–15.5)
WBC: 7.7 10*3/uL (ref 4.0–10.5)
nRBC: 0 % (ref 0.0–0.2)

## 2021-11-30 LAB — CBG MONITORING, ED: Glucose-Capillary: 100 mg/dL — ABNORMAL HIGH (ref 70–99)

## 2021-11-30 NOTE — ED Triage Notes (Signed)
Patient arrives ambulatory with complaints of left-sided facial droop that he discovered yesterday at 1000. Patient reports no other symptoms (no speech issues of extremity weakness).  Patient reports previous stroke 2 years ago.

## 2021-11-30 NOTE — ED Provider Notes (Signed)
Care assumed from Dr. Darl Householder, patient with probable Bell's Palsy pending MRI to rule out new stroke.  MRI shows an acute stroke in the right corona radiata, adjacent to location of prior stroke.  Also, hemosiderin deposits are concerning for either hypertensive microhemorrhages, but considered cerebral amyloid angiopathy.  I have independently viewed the images, and agree with the radiologist interpretation.  I have also independently discussed the findings with the radiologist.  Case is discussed with Dr. Nevada Crane of [facility] hospitalist, who agrees to admit the patient.  Case also was discussed with Dr. Cheral Marker of neurology service who agrees to see the patient in consultation.  Results for orders placed or performed during the hospital encounter of 11/30/21  CBC  Result Value Ref Range   WBC 7.7 4.0 - 10.5 K/uL   RBC 5.24 4.22 - 5.81 MIL/uL   Hemoglobin 15.6 13.0 - 17.0 g/dL   HCT 44.4 39.0 - 52.0 %   MCV 84.7 80.0 - 100.0 fL   MCH 29.8 26.0 - 34.0 pg   MCHC 35.1 30.0 - 36.0 g/dL   RDW 13.1 11.5 - 15.5 %   Platelets 251 150 - 400 K/uL   nRBC 0.0 0.0 - 0.2 %  Differential  Result Value Ref Range   Neutrophils Relative % 53 %   Neutro Abs 4.2 1.7 - 7.7 K/uL   Lymphocytes Relative 33 %   Lymphs Abs 2.5 0.7 - 4.0 K/uL   Monocytes Relative 7 %   Monocytes Absolute 0.5 0.1 - 1.0 K/uL   Eosinophils Relative 5 %   Eosinophils Absolute 0.4 0.0 - 0.5 K/uL   Basophils Relative 1 %   Basophils Absolute 0.0 0.0 - 0.1 K/uL   Immature Granulocytes 1 %   Abs Immature Granulocytes 0.08 (H) 0.00 - 0.07 K/uL  Comprehensive metabolic panel  Result Value Ref Range   Sodium 139 135 - 145 mmol/L   Potassium 3.7 3.5 - 5.1 mmol/L   Chloride 104 98 - 111 mmol/L   CO2 25 22 - 32 mmol/L   Glucose, Bld 109 (H) 70 - 99 mg/dL   BUN 12 6 - 20 mg/dL   Creatinine, Ser 0.84 0.61 - 1.24 mg/dL   Calcium 9.9 8.9 - 10.3 mg/dL   Total Protein 7.4 6.5 - 8.1 g/dL   Albumin 4.7 3.5 - 5.0 g/dL   AST 24 15 - 41 U/L   ALT 46  (H) 0 - 44 U/L   Alkaline Phosphatase 63 38 - 126 U/L   Total Bilirubin 0.7 0.3 - 1.2 mg/dL   GFR, Estimated >60 >60 mL/min   Anion gap 10 5 - 15  CBG monitoring, ED  Result Value Ref Range   Glucose-Capillary 100 (H) 70 - 99 mg/dL   MR BRAIN WO CONTRAST  Result Date: 12/01/2021 CLINICAL DATA:  Facial droop EXAM: MRI HEAD WITHOUT CONTRAST TECHNIQUE: Multiplanar, multiecho pulse sequences of the brain and surrounding structures were obtained without intravenous contrast. COMPARISON:  09/28/2019 MRI FINDINGS: Brain: Small area of restricted diffusion with ADC correlate in the right corona radiata (series 5, images 81-84), which appears to be in a similar distribution to the 09/28/2019 infarct but is slightly larger. No acute hemorrhage, mass, mass effect, or midline shift. No hydrocephalus or extra-axial collection. Foci of hemosiderin deposition in the bilateral cerebral hemispheres, most prominently in the left occipital lobe, as well as in the bilateral basal ganglia and thalami. Lacunar infarcts in the bilateral basal ganglia, corona radiata, and left pons. T2 hyperintense signal  in the periventricular white matter, likely the sequela of moderate chronic small vessel ischemic disease. Vascular: Normal flow voids. Skull and upper cervical spine: Normal marrow signal. Sinuses/Orbits: Mild mucosal thickening in the maxillary sinuses, left sphenoid sinus, and ethmoid air cells. The orbits are unremarkable. Other: The mastoids are well aerated. IMPRESSION: 1. Small area of acute infarction in the right corona radiata, which appears to be in a similar area to the 09/28/2019 infarct. 2. Numerous foci of hemosiderin deposition in the bilateral cerebral hemispheres most prominently in the left occipital lobe, as well as in the bilateral basal ganglia and thalami. While some of these may reflect the sequela of prior hypertensive microhemorrhages, cerebral amyloid angiopathy could appear similar, particularly with  regards to the cerebral microhemorrhages. These results were called by telephone at the time of interpretation on 12/01/2021 at 1:41 am to provider [facility] , who verbally acknowledged these results. Electronically Signed   By: Merilyn Baba M.D.   On: 39/08/90 33:00       Delora Fuel, MD 76/22/63 [phone]

## 2021-11-30 NOTE — ED Provider Notes (Signed)
[facility] EMERGENCY DEPT Provider Note   CSN: [id number] Arrival date & time: 11/30/21  1704     History  Chief Complaint  Patient presents with   Facial Droop    Dylan Fuller is a 56 y.o. male.  HPI Patient has a history of hypertension hyperlipidemia and prior stroke. Patient presented to the ED for evaluation of left-sided facial droop.  Patient states he noticed the symptoms yesterday morning when he woke up.  Last time he was known normal was the night prior.  Patient states the symptoms were not too severe and he had some dental work recently so he thought maybe it could be related to that.  He is not having any trouble with his speech.  No trouble with his vision.  No trouble with numbness or weakness in his arms and legs.  Home Medications Prior to Admission medications   Medication Sig Start Date End Date Taking? Authorizing Provider  amLODipine (NORVASC) 5 MG tablet Take 1 tablet (5 mg total) by mouth daily. 08/21/21   Isaac Bliss, Rayford Halsted, MD  aspirin EC 81 MG tablet Take 1 tablet (81 mg total) by mouth daily. 09/29/19   Patrecia Pour, MD  atorvastatin (LIPITOR) 40 MG tablet Take 1 tablet (40 mg total) by mouth daily. 08/21/21   Isaac Bliss, Rayford Halsted, MD  predniSONE (STERAPRED UNI-PAK 21 TAB) 10 MG (21) TBPK tablet Take as directed 05/13/21   Isaac Bliss, Rayford Halsted, MD      Allergies    Patient has no known allergies.    Review of Systems   Review of Systems  Constitutional:  Negative for fever.    Physical Exam Updated Vital Signs BP (!) 161/96 (BP Location: Right Arm)   Pulse 88   Temp 98 F (36.7 C) (Oral)   Resp 18   Ht 1.803 m ('5\' 11"'$ )   Wt 104.3 kg   SpO2 96%   BMI 32.08 kg/m  Physical Exam Vitals and nursing note reviewed.  Constitutional:      General: He is not in acute distress.    Appearance: He is well-developed.  HENT:     Head: Normocephalic and atraumatic.     Right Ear: External ear normal.     Left Ear:  External ear normal.  Eyes:     General: No scleral icterus.       Right eye: No discharge.        Left eye: No discharge.     Conjunctiva/sclera: Conjunctivae normal.  Neck:     Trachea: No tracheal deviation.  Cardiovascular:     Rate and Rhythm: Normal rate and regular rhythm.  Pulmonary:     Effort: Pulmonary effort is normal. No respiratory distress.     Breath sounds: Normal breath sounds. No stridor. No wheezing or rales.  Abdominal:     General: Bowel sounds are normal. There is no distension.     Palpations: Abdomen is soft.     Tenderness: There is no abdominal tenderness. There is no guarding or rebound.  Musculoskeletal:        General: No tenderness.     Cervical back: Neck supple.  Skin:    General: Skin is warm and dry.     Findings: No rash.  Neurological:     Mental Status: He is alert and oriented to person, place, and time.     Cranial Nerves: Cranial nerve deficit present.     Sensory: No sensory deficit.  Motor: No abnormal muscle tone or seizure activity.     Coordination: Coordination normal.     Comments: No pronator drift bilateral upper extrem, able to hold both legs off bed for 5 seconds, sensation intact in all extremities, no visual field cuts, no left or right sided neglect, normal finger-nose exam bilaterally, no nystagmus noted  Left-sided facial droop, no definite involvement of the forehead, extraocular movements intact, tongue midline     ED Results / Procedures / Treatments   Labs (all labs ordered are listed, but only abnormal results are displayed) Labs Reviewed  CBG MONITORING, ED - Abnormal; Notable for the following components:      Result Value   Glucose-Capillary 100 (*)    All other components within normal limits  CBC  DIFFERENTIAL  COMPREHENSIVE METABOLIC PANEL    EKG EKG Interpretation  Date/Time:  Sunday November 30 2021 17:11:17 EDT Ventricular Rate:  80 PR Interval:  186 QRS Duration: 103 QT Interval:  376 QTC  Calculation: 434 R Axis:   40 Text Interpretation: Sinus rhythm Probable left atrial enlargement Borderline repolarization abnormality No significant change since last tracing Confirmed by Dorie Rank [phone]) on 11/30/2021 5:17:42 PM  Radiology No results found.  Procedures Procedures    Medications Ordered in ED Medications - No data to display  ED Course/ Medical Decision Making/ A&P Clinical Course as of 11/30/21 1745  Sun Nov 30, 2021  1743 Case discussed with Dr. Dina Rich, patient will transfer to [facility] for MRI and further evaluation [JK]  6283 CBG monitoring, ED(!) CBG normal [JK]    Clinical Course User Index [JK] Dorie Rank, MD                           Medical Decision Making Amount and/or Complexity of Data Reviewed Labs: ordered. Decision-making details documented in ED Course. Radiology: ordered.   Patient presents to the ED for evaluation of a facial droop.  Differential diagnosis includes but not limited to stroke, tumor, Bell's palsy.  Patient is not having any other neurologic symptoms other than the facial droop.  Most likely is a Bell's palsy however the patient has had a stroke in the past.  I do not see definitive forehead involvement.  I do think it is prudent to have advanced imaging to rule out acute stroke considering his history.  CT scan and MRI is not available at this facility.  Plan will be to have him transferred to [facility], ED.  I discussed case with Dr. Dina Rich.  Patient's wife will transport him by POV        Final Clinical Impression(s) / ED Diagnoses Final diagnoses:  Facial droop  Hypertension, unspecified type    Rx / DC Orders ED Discharge Orders     None         Dorie Rank, MD 11/30/21 [id number]

## 2021-12-01 ENCOUNTER — Observation Stay (HOSPITAL_COMMUNITY): Payer: Managed Care, Other (non HMO)

## 2021-12-01 DIAGNOSIS — I639 Cerebral infarction, unspecified: Secondary | ICD-10-CM

## 2021-12-01 DIAGNOSIS — I6389 Other cerebral infarction: Secondary | ICD-10-CM

## 2021-12-01 LAB — LIPID PANEL
Cholesterol: 116 mg/dL (ref 0–200)
HDL: 34 mg/dL — ABNORMAL LOW (ref >40)
LDL Cholesterol: 58 mg/dL (ref 0–99)
Total CHOL/HDL Ratio: 3.4 RATIO
Triglycerides: 121 mg/dL (ref <150)
VLDL: 24 mg/dL (ref 0–40)

## 2021-12-01 LAB — COMPREHENSIVE METABOLIC PANEL
ALT: 46 U/L — ABNORMAL HIGH (ref 0–44)
AST: 28 U/L (ref 15–41)
Albumin: 3.8 g/dL (ref 3.5–5.0)
Alkaline Phosphatase: 64 U/L (ref 38–126)
Anion gap: 7 (ref 5–15)
BUN: 10 mg/dL (ref 6–20)
CO2: 24 mmol/L (ref 22–32)
Calcium: 9 mg/dL (ref 8.9–10.3)
Chloride: 104 mmol/L (ref 98–111)
Creatinine, Ser: 0.81 mg/dL (ref 0.61–1.24)
GFR, Estimated: >60 mL/min (ref >60)
Glucose, Bld: 93 mg/dL (ref 70–99)
Potassium: 3.4 mmol/L — ABNORMAL LOW (ref 3.5–5.1)
Sodium: 135 mmol/L (ref 135–145)
Total Bilirubin: 1 mg/dL (ref 0.3–1.2)
Total Protein: 6.6 g/dL (ref 6.5–8.1)

## 2021-12-01 LAB — CBC WITH DIFFERENTIAL/PLATELET
Abs Immature Granulocytes: 0.03 10*3/uL (ref 0.00–0.07)
Basophils Absolute: 0 10*3/uL (ref 0.0–0.1)
Basophils Relative: 1 %
Eosinophils Absolute: 0.3 10*3/uL (ref 0.0–0.5)
Eosinophils Relative: 4 %
HCT: 42.8 % (ref 39.0–52.0)
Hemoglobin: 15 g/dL (ref 13.0–17.0)
Immature Granulocytes: 0 %
Lymphocytes Relative: 34 %
Lymphs Abs: 2.9 10*3/uL (ref 0.7–4.0)
MCH: 30.1 pg (ref 26.0–34.0)
MCHC: 35 g/dL (ref 30.0–36.0)
MCV: 85.8 fL (ref 80.0–100.0)
Monocytes Absolute: 0.7 10*3/uL (ref 0.1–1.0)
Monocytes Relative: 8 %
Neutro Abs: 4.5 10*3/uL (ref 1.7–7.7)
Neutrophils Relative %: 53 %
Platelets: 228 10*3/uL (ref 150–400)
RBC: 4.99 MIL/uL (ref 4.22–5.81)
RDW: 13.1 % (ref 11.5–15.5)
WBC: 8.4 10*3/uL (ref 4.0–10.5)
nRBC: 0 % (ref 0.0–0.2)

## 2021-12-01 LAB — ECHOCARDIOGRAM COMPLETE BUBBLE STUDY
AR max vel: 4.27 cm2
AV Peak grad: 2.8 mmHg
Ao pk vel: 0.84 m/s
Area-P 1/2: 3.21 cm2
S' Lateral: 2.6 cm

## 2021-12-01 LAB — HEMOGLOBIN A1C
Hgb A1c MFr Bld: 5.2 % (ref 4.8–5.6)
Mean Plasma Glucose: 102.54 mg/dL

## 2021-12-01 LAB — MAGNESIUM: Magnesium: 2.2 mg/dL (ref 1.7–2.4)

## 2021-12-01 LAB — PHOSPHORUS: Phosphorus: 3 mg/dL (ref 2.5–4.6)

## 2021-12-01 MED ORDER — LACTATED RINGERS IV SOLN: INTRAVENOUS | Status: AC

## 2021-12-01 MED ORDER — POLYETHYLENE GLYCOL 3350 17 G PO PACK: 17.0000 g | PACK | Freq: Every day | ORAL | Status: DC | PRN

## 2021-12-01 MED ORDER — MELATONIN 5 MG PO TABS: 5.0000 mg | ORAL_TABLET | Freq: Every evening | ORAL | Status: DC | PRN

## 2021-12-01 MED ORDER — LORAZEPAM 2 MG/ML IJ SOLN
1.0000 mg | Freq: Once | INTRAMUSCULAR | Status: AC
  Administered 2021-12-01: 1 mg via INTRAVENOUS
  Filled 2021-12-01: qty 1

## 2021-12-01 MED ORDER — CLOPIDOGREL BISULFATE 75 MG PO TABS
75.0000 mg | ORAL_TABLET | Freq: Every day | ORAL | Status: DC
  Administered 2021-12-01 – 2021-12-03 (×3): 75 mg via ORAL
  Filled 2021-12-01 (×3): qty 1

## 2021-12-01 MED ORDER — POTASSIUM CHLORIDE 20 MEQ PO PACK
40.0000 meq | PACK | Freq: Once | ORAL | Status: AC
  Administered 2021-12-01: 40 meq via ORAL
  Filled 2021-12-01: qty 2

## 2021-12-01 MED ORDER — IOHEXOL 350 MG/ML SOLN
75.0000 mL | Freq: Once | INTRAVENOUS | Status: AC | PRN
  Administered 2021-12-01: 75 mL via INTRAVENOUS

## 2021-12-01 MED ORDER — ACETAMINOPHEN 325 MG PO TABS
650.0000 mg | ORAL_TABLET | Freq: Four times a day (QID) | ORAL | Status: DC | PRN
  Administered 2021-12-01 – 2021-12-02 (×2): 650 mg via ORAL
  Filled 2021-12-01 (×2): qty 2

## 2021-12-01 MED ORDER — ATORVASTATIN CALCIUM 40 MG PO TABS
40.0000 mg | ORAL_TABLET | Freq: Every day | ORAL | Status: DC
  Administered 2021-12-01 – 2021-12-03 (×3): 40 mg via ORAL
  Filled 2021-12-01 (×3): qty 1

## 2021-12-01 MED ORDER — ASPIRIN 81 MG PO TBEC
81.0000 mg | DELAYED_RELEASE_TABLET | Freq: Every day | ORAL | Status: DC
  Administered 2021-12-01 – 2021-12-03 (×3): 81 mg via ORAL
  Filled 2021-12-01 (×3): qty 1

## 2021-12-01 NOTE — ED Notes (Signed)
Pt noted to ambulate to restroom and back w/o difficulty.

## 2021-12-01 NOTE — H&P (Addendum)
History and Physical  Dylan Fuller ACZ:[id number] DOB: 08-20-1965 DOA: 11/30/2021  Referring physician: Dr. Roxanne Fuller, EDP  PCP: Dylan Fuller, Dylan Halsted, MD  Outpatient Specialists: Neurology Patient coming from: Home  Chief Complaint: Facial droop   HPI: Dylan Fuller is a 56 y.o. male with medical history significant for prior CVA 2 years ago on 81 mg aspirin and 40 mg Lipitor, obesity, essential hypertension on 5 mg Norvasc, who presented to [facility] ED due to complaints of persistent left-sided facial droop with onset 11/29/2021 AM.  He noted it while brushing his teeth.  He thought his left facial droop was related to a dental cleaning he had done the afternoon before.  As the day went on he continued to have left facial droop.  The following day it persisted so he decided to have it checked out.    He initially went to [facility] ED.  No MRI available at that facility.  He was transferred to [facility] ED.  Noncontrast MRI brain revealed acute CVA involving small area of acute infarction in the right corona radiata which appeared to be in similar area of the 09/28/2019 infarct, with microhemorrhages.  Neurology/stroke team was consulted by EDP.  TR[facility], hospitalist service, was asked to admit for stroke work-up.  At the time of this visit, the patient continues to have left facial droop.  No aphasia.  No dysphagia.  No loss of taste.  ED Course: Tmax 98.6.  BP 123/86, pulse 86, respiratory 16, saturation 93% on room air.  Lab studies remarkable for serum glucose 109, ALT 46.  CBC essentially unremarkable.  Review of Systems: Review of systems as noted in the HPI. All other systems reviewed and are negative.   Past Medical History:  Diagnosis Date   Constipation    CVA (cerebral vascular accident) ([place])    09/2019   Hyperlipidemia    Hypertension    Obesity (BMI 30.0-34.9)    Prostatitis, chronic    Past Surgical History:  Procedure Laterality Date   HERNIA REPAIR     age 79 - ?ingunial x2     Social History:  reports that he has never smoked. He has never used smokeless tobacco. He reports current alcohol use. He reports that he does not use drugs.   No Known Allergies  Family History  Problem Relation Age of Onset   Arthritis Mother    Heart disease Mother    Hyperlipidemia Mother    Hypertension Mother    Dementia Mother    Heart disease Father        12; post-rheumatic fever valve disease   Colon cancer Neg Hx    Colon polyps Neg Hx    Esophageal cancer Neg Hx    Rectal cancer Neg Hx    Stomach cancer Neg Hx    Stroke Neg Hx       Prior to Admission medications   Medication Sig Start Date End Date Taking? Authorizing Provider  amLODipine (NORVASC) 5 MG tablet Take 1 tablet (5 mg total) by mouth daily. 08/21/21   Dylan Fuller, Dylan Halsted, MD  aspirin EC 81 MG tablet Take 1 tablet (81 mg total) by mouth daily. 09/29/19   Patrecia Pour, MD  atorvastatin (LIPITOR) 40 MG tablet Take 1 tablet (40 mg total) by mouth daily. 08/21/21   Dylan Fuller, Dylan Halsted, MD  predniSONE (STERAPRED UNI-PAK 21 TAB) 10 MG (21) TBPK tablet Take as directed 05/13/21   Dylan Fuller, Dylan Halsted, MD  Physical Exam: BP 123/86 (BP Location: Right Arm)   Pulse 86   Temp 98.6 F (37 C) (Oral)   Resp 16   Ht '5\' 11"'$  (1.803 m)   Wt 104.3 kg   SpO2 93%   BMI 32.08 kg/m   General: 56 y.o. year-old male well developed well nourished in no acute distress.  Alert and oriented x3.  Left facial droop. Cardiovascular: Sinus rhythm with intermittent skipped beats with no rubs or gallops.  No thyromegaly or JVD noted.  No lower extremity edema. 2/4 pulses in all 4 extremities. Respiratory: Clear to auscultation with no wheezes or rales. Good inspiratory effort. Abdomen: Soft nontender nondistended with normal bowel sounds x4 quadrants. Muskuloskeletal: No cyanosis, clubbing or edema noted bilaterally Neuro: CN II-XII intact, strength, sensation, reflexes Skin: No ulcerative lesions noted  or rashes Psychiatry: Judgement and insight appear normal. Mood is appropriate for condition and setting          Labs on Admission:  Basic Metabolic Panel: Recent Labs  Lab 11/30/21 1755  NA 139  K 3.7  CL 104  CO2 25  GLUCOSE 109*  BUN 12  CREATININE 0.84  CALCIUM 9.9   Liver Function Tests: Recent Labs  Lab 11/30/21 1755  AST 24  ALT 46*  ALKPHOS 63  BILITOT 0.7  PROT 7.4  ALBUMIN 4.7   No results for input(s): "LIPASE", "AMYLASE" in the last 168 hours. No results for input(s): "AMMONIA" in the last 168 hours. CBC: Recent Labs  Lab 11/30/21 1755  WBC 7.7  NEUTROABS 4.2  HGB 15.6  HCT 44.4  MCV 84.7  PLT 251   Cardiac Enzymes: No results for input(s): "CKTOTAL", "CKMB", "CKMBINDEX", "TROPONINI" in the last 168 hours.  BNP (last 3 results) No results for input(s): "BNP" in the last 8760 hours.  ProBNP (last 3 results) No results for input(s): "PROBNP" in the last 8760 hours.  CBG: Recent Labs  Lab 11/30/21 1713  GLUCAP 100*    Radiological Exams on Admission: MR BRAIN WO CONTRAST  Result Date: 12/01/2021 CLINICAL DATA:  Facial droop EXAM: MRI HEAD WITHOUT CONTRAST TECHNIQUE: Multiplanar, multiecho pulse sequences of the brain and surrounding structures were obtained without intravenous contrast. COMPARISON:  09/28/2019 MRI FINDINGS: Brain: Small area of restricted diffusion with ADC correlate in the right corona radiata (series 5, images 81-84), which appears to be in a similar distribution to the 09/28/2019 infarct but is slightly larger. No acute hemorrhage, mass, mass effect, or midline shift. No hydrocephalus or extra-axial collection. Foci of hemosiderin deposition in the bilateral cerebral hemispheres, most prominently in the left occipital lobe, as well as in the bilateral basal ganglia and thalami. Lacunar infarcts in the bilateral basal ganglia, corona radiata, and left pons. T2 hyperintense signal in the periventricular white matter, likely the  sequela of moderate chronic small vessel ischemic disease. Vascular: Normal flow voids. Skull and upper cervical spine: Normal marrow signal. Sinuses/Orbits: Mild mucosal thickening in the maxillary sinuses, left sphenoid sinus, and ethmoid air cells. The orbits are unremarkable. Other: The mastoids are well aerated. IMPRESSION: 1. Small area of acute infarction in the right corona radiata, which appears to be in a similar area to the 09/28/2019 infarct. 2. Numerous foci of hemosiderin deposition in the bilateral cerebral hemispheres most prominently in the left occipital lobe, as well as in the bilateral basal ganglia and thalami. While some of these may reflect the sequela of prior hypertensive microhemorrhages, cerebral amyloid angiopathy could appear similar, particularly with regards to the  cerebral microhemorrhages. These results were called by telephone at the time of interpretation on 12/01/2021 at 1:41 am to provider [facility] , who verbally acknowledged these results. Electronically Signed   By: Merilyn Baba M.D.   On: 12/01/2021 01:41    EKG: I independently viewed the EKG done and my findings are as followed: Sinus rhythm rate of 80.  Nonspecific ST-T changes.  QTc 434.  Assessment/Plan Present on Admission:  CVA (cerebral vascular accident) ([place])  Principal Problem:   CVA (cerebral vascular accident) ([place])  Acute CVA, recurrent Presented with left facial droop Last known well 11/29/2021 around 10 AM Ongoing stroke work-up MRI brain revealed acute CVA involving small area of acute infarction in the right corona radiator which appeared to be in similar area of the 09/28/2019 infarct, with numerous microhemorrhages involving the bilateral cerebral hemispheres, most prominently in the left occipital lobe as well as in the bilateral basal ganglia and thalami. CTA head and neck with and without contrast to rule out LVO Gentle IV fluid hydration LR 50 cc/h x 1 day to avoid contrast nephropathy 2D  echo with bubble study Frequent neuro-checks Telemetry monitoring to rule out arrhythmia Fasting lipid panel, hemoglobin A1c PT/OT/speech therapist evaluation. Defer antiplatelets choice to neurology due to microhemorrhages seen on MRI. Restart home statin, Lipitor 40 mg daily Goal LDL less than 70 Goal A1c less than 7.0   Essential hypertension Ongoing permissive hypertension Treat SBP greater than 220 or DBP greater than 120 Continue to hold off home Norvasc 5 mg daily Closely monitor vital signs  Isolated mildly elevated liver chemistry ALT 46 Monitor while on statin, repeat chemistry panel in the morning.  Obesity BMI 32 Recommend weight loss outpatient with regular physical activity and healthy dieting. At risk for OSA   DVT prophylaxis: SCDs.  Pharmacological DVT prophylaxis held due to microhemorrhages seen on MRI brain.  Code Status: Full code  Family Communication: Wife at bedside  Disposition Plan: Admitted to telemetry medical unit  Consults called: Neurology consulted by EDP  Admission status: Observation status.   Status is: Observation    Kayleen Memos MD [facility] Pager [phone]  If 7PM-7AM, please contact night-coverage [email or web address] Password [email or web address]  12/01/2021, 3:01 AM

## 2021-12-01 NOTE — Progress Notes (Signed)
Speech Language Pathology    Pt seen for speech-language-cognition. Pt's language and cognition screened and within normal limits. Does not need formal assessment  His speech is intelligible per pt/wife and SLP feels it is 100% intelligible. He does have left sided CN VII weakness and asking about return of movement and any exercises.  SLP educated and gave several exercises that he can do and educated on lack of evidence base on exercises. No ST needed.    Orbie Pyo [place].Ed [employer] [phone]                                [place] Siren  12/01/2021, 1:08 PM

## 2021-12-01 NOTE — Evaluation (Signed)
Occupational Therapy Evaluation Patient Details Name: Dylan Fuller MRN: [id number] DOB: Feb 18, 1966 Today's Date: 12/01/2021   History of Present Illness 56 yo male presenting with left facial droop. MRI brain revealed acute CVA involving small area of acute infarction in the right corona radiata. PMH including prior CVA 2 years ago, obesity, and essential hypertension.   Clinical Impression   PTA, pt was living with his wife and son and was independent; working as a Software engineer. Pt currently performing at baseline function and demonstrating WFL for balance, strength, and cognition. Pt only reporting left sided facial droop with numbness. Recommend dc to home once medically stable per physician. All acute OT needs met. Will sign off.       Recommendations for follow up therapy are one component of a multi-disciplinary discharge planning process, led by the attending physician.  Recommendations may be updated based on patient status, additional functional criteria and insurance authorization.   Follow Up Recommendations  No OT follow up    Assistance Recommended at Discharge PRN  Patient can return home with the following      Functional Status Assessment  Patient has had a recent decline in their functional status and demonstrates the ability to make significant improvements in function in a reasonable and predictable amount of time.  Equipment Recommendations  None recommended by OT    Recommendations for Other Services       Precautions / Restrictions Precautions Precautions: None Restrictions Weight Bearing Restrictions: No      Mobility Bed Mobility Overal bed mobility: Independent                  Transfers Overall transfer level: Independent                        Balance Overall balance assessment: Independent                               Standardized Balance Assessment Standardized Balance Assessment : Dynamic Gait Index    Dynamic Gait Index Level Surface: Normal Change in Gait Speed: Normal Gait with Horizontal Head Turns: Normal Gait with Vertical Head Turns: Normal Gait and Pivot Turn: Normal Step Over Obstacle: Normal Step Around Obstacles: Normal Steps: Normal Total Score: 24     ADL either performed or assessed with clinical judgement   ADL Overall ADL's : At baseline                                             Vision Baseline Vision/History: 0 No visual deficits Vision Assessment?: No apparent visual deficits     Perception     Praxis      Pertinent Vitals/Pain Pain Assessment Pain Assessment: No/denies pain     Hand Dominance Right   Extremity/Trunk Assessment Upper Extremity Assessment Upper Extremity Assessment: Overall WFL for tasks assessed   Lower Extremity Assessment Lower Extremity Assessment: Overall WFL for tasks assessed   Cervical / Trunk Assessment Cervical / Trunk Assessment: Normal   Communication Communication Communication: No difficulties   Cognition Arousal/Alertness: Awake/alert Behavior During Therapy: WFL for tasks assessed/performed Overall Cognitive Status: Within Functional Limits for tasks assessed  General Comments: Answering word finding questions. FOllowing mutistep directions. Answering money management question without difficulty     General Comments  VSS. Continued to report L facial numbness and droop    Exercises     Shoulder Instructions      Home Living Family/patient expects to be discharged to:: Private residence Living Arrangements: Spouse/significant other;Children Available Help at Discharge: Family;Available 24 hours/day (Son is at [employer]) Type of Home: House Home Access: Stairs to enter [employer] of Steps: 2 Entrance Stairs-Rails: None Home Layout: Two level     Bathroom Shower/Tub: Occupational psychologist: Standard     Home  Equipment: None          Prior Functioning/Environment Prior Level of Function : Independent/Modified Independent;Driving;Working/employed               ADLs Comments: Pharmacist        OT Problem List: Decreased knowledge of use of DME or AE;Decreased knowledge of precautions;Impaired sensation      OT Treatment/Interventions:      OT Goals(Current goals can be found in the care plan section) Acute Rehab OT Goals Patient Stated Goal: Go home soon OT Goal Formulation: All assessment and education complete, DC therapy  OT Frequency:      Co-evaluation              AM-PAC OT "6 Clicks" Daily Activity     Outcome Measure Help from another person eating meals?: None Help from another person taking care of personal grooming?: None Help from another person toileting, which includes using toliet, bedpan, or urinal?: None Help from another person bathing (including washing, rinsing, drying)?: None Help from another person to put on and taking off regular upper body clothing?: None Help from another person to put on and taking off regular lower body clothing?: None 6 Click Score: 24   End of Session Nurse Communication: Mobility status  Activity Tolerance: Patient tolerated treatment well Patient left: in bed;with call bell/phone within reach (with MD)  OT Visit Diagnosis: Muscle weakness (generalized) (M62.81)                Time: 4098-1191 OT Time Calculation (min): 18 min Charges:  OT General Charges $OT Visit: 1 Visit OT Evaluation $OT Eval Low Complexity: 1 Low  Jeselle Hiser MSOT, OTR/L Acute Rehab Office: [place] 12/01/2021, 9:58 AM

## 2021-12-01 NOTE — Consult Note (Signed)
NEURO HOSPITALIST CONSULT NOTE   Requestig physician: Dr. Roxanne Mins  Reason for Consult: Acute right corona radiata ischemic infarction on MRI  History obtained from:  Patient and Chart     HPI:                                                                                                                                          Dylan Fuller is an 56 y.o. male with a PMHx of stroke in 2021 (is seen at [facility] for follow ups), HLD, HTN, obesity and chronic prostatitis who presented to the [facility] ED on 'Sunday evening with complaints of left sided facial droop that had been first noticed on Saturday morning at 1000 upon waking. LKN was the night prior. He denied any speech difficulty or limb weakness at that time. He was sent to the [facility] ED where MRI brain revealed an acute right corona radiata ischemic infarction as well as multiple chronic foci of hemosiderin deposition concerning for amyloid angiopathy versus hypertensive hemorrhages.   On bedside interview, the patient denies any neurological symptoms other than his persisting left facial droop, including no dysphasia, dysarthria, limb weakness, limb numbness, incoordination, face numbness, ataxia or difficulty walking.    Past Medical History:  Diagnosis Date   Constipation    CVA (cerebral vascular accident) (HCC)    09/2019   Hyperlipidemia    Hypertension    Obesity (BMI 30.0-34.9)    Prostatitis, chronic     Past Surgical History:  Procedure Laterality Date   HERNIA REPAIR     age 1 - ?ingunial x2    Family History  Problem Relation Age of Onset   Arthritis Mother    Heart disease Mother    Hyperlipidemia Mother    Hypertension Mother    Dementia Mother    Heart disease Father        61'$ ; post-rheumatic fever valve disease   Colon cancer Neg Hx    Colon polyps Neg Hx    Esophageal cancer Neg Hx    Rectal cancer Neg Hx    Stomach cancer Neg Hx    Stroke Neg Hx             Social  History:  reports that he has never smoked. He has never used smokeless tobacco. He reports current alcohol use. He reports that he does not use drugs.  No Known Allergies  MEDICATIONS:  No current facility-administered medications on file prior to encounter.   Current Outpatient Medications on File Prior to Encounter  Medication Sig Dispense Refill   amLODipine (NORVASC) 5 MG tablet Take 1 tablet (5 mg total) by mouth daily. 90 tablet 0   aspirin EC 81 MG tablet Take 1 tablet (81 mg total) by mouth daily. 30 tablet 0   atorvastatin (LIPITOR) 40 MG tablet Take 1 tablet (40 mg total) by mouth daily. 90 tablet 0   cholecalciferol (VITAMIN D3) 25 MCG (1000 UNIT) tablet Take 2,000 Units by mouth daily.     vitamin B-12 (CYANOCOBALAMIN) 1000 MCG tablet Take 1,000 mcg by mouth daily.     predniSONE (STERAPRED UNI-PAK 21 TAB) 10 MG (21) TBPK tablet Take as directed (Patient not taking: Reported on 12/01/2021) 21 tablet 0    Scheduled:  atorvastatin  40 mg Oral Daily   Continuous:  lactated ringers 50 mL/hr at 12/01/21 0550     ROS:                                                                                                                                       As per HPI. Comprehensive ROS otherwise negative.    Blood pressure 123/86, pulse 86, temperature 98.6 F (37 C), temperature source Oral, resp. rate 16, height '5\' 11"'$  (1.803 m), weight 104.3 kg, SpO2 93 %.   General Examination:                                                                                                       Physical Exam  HEENT-  /AT  Lungs- Respirations unlabored Extremities- No edema  Neurological Examination Mental Status: Alert, oriented x 5, thought content appropriate.  Speech fluent without evidence of aphasia. No dysarthria. Able to follow all commands without  difficulty. Cranial Nerves: II: Temporal visual fields intact with no extinction to DSS. PERRL  III,IV, VI: No ptosis. EOMI. No nystagmus.  V: Temp sensation equal bilaterally  VII: Left facial droop. Left palpebral fissure wider than on the right, which patient states is chronic. Eyelid closure strength is full and equal bilaterally.  VIII: Hearing intact to voice IX,X: No hoarseness XI: Symmetric shoulder shrug XII: Midline tongue extension Motor: RUE 5/5 proximally and distally LUE 5/5 proximally and distally except for 4/5 left deltoid BLE 5/5 proximally and distally without asymmetry No pronator drift.  Sensory: Temp and light touch intact throughout, bilaterally. No extinction to DSS.  Deep Tendon Reflexes: 2+ and  symmetric throughout Cerebellar: No ataxia with FNF or H-S bilaterally  Gait: Deferred   Lab Results: Basic Metabolic Panel: Recent Labs  Lab 11/30/21 1755  NA 139  K 3.7  CL 104  CO2 25  GLUCOSE 109*  BUN 12  CREATININE 0.84  CALCIUM 9.9    CBC: Recent Labs  Lab 11/30/21 1755  WBC 7.7  NEUTROABS 4.2  HGB 15.6  HCT 44.4  MCV 84.7  PLT 251    Cardiac Enzymes: No results for input(s): "CKTOTAL", "CKMB", "CKMBINDEX", "TROPONINI" in the last 168 hours.  Lipid Panel: No results for input(s): "CHOL", "TRIG", "HDL", "CHOLHDL", "VLDL", "LDLCALC" in the last 168 hours.  Imaging: MR BRAIN WO CONTRAST  Result Date: 12/01/2021 CLINICAL DATA:  Facial droop EXAM: MRI HEAD WITHOUT CONTRAST TECHNIQUE: Multiplanar, multiecho pulse sequences of the brain and surrounding structures were obtained without intravenous contrast. COMPARISON:  09/28/2019 MRI FINDINGS: Brain: Small area of restricted diffusion with ADC correlate in the right corona radiata (series 5, images 81-84), which appears to be in a similar distribution to the 09/28/2019 infarct but is slightly larger. No acute hemorrhage, mass, mass effect, or midline shift. No hydrocephalus or extra-axial  collection. Foci of hemosiderin deposition in the bilateral cerebral hemispheres, most prominently in the left occipital lobe, as well as in the bilateral basal ganglia and thalami. Lacunar infarcts in the bilateral basal ganglia, corona radiata, and left pons. T2 hyperintense signal in the periventricular white matter, likely the sequela of moderate chronic small vessel ischemic disease. Vascular: Normal flow voids. Skull and upper cervical spine: Normal marrow signal. Sinuses/Orbits: Mild mucosal thickening in the maxillary sinuses, left sphenoid sinus, and ethmoid air cells. The orbits are unremarkable. Other: The mastoids are well aerated. IMPRESSION: 1. Small area of acute infarction in the right corona radiata, which appears to be in a similar area to the 09/28/2019 infarct. 2. Numerous foci of hemosiderin deposition in the bilateral cerebral hemispheres most prominently in the left occipital lobe, as well as in the bilateral basal ganglia and thalami. While some of these may reflect the sequela of prior hypertensive microhemorrhages, cerebral amyloid angiopathy could appear similar, particularly with regards to the cerebral microhemorrhages. These results were called by telephone at the time of interpretation on 12/01/2021 at 1:41 am to provider [facility] , who verbally acknowledged these results. Electronically Signed   By: Merilyn Baba M.D.   On: 12/01/2021 01:41     Assessment: 56 year old male with acute right corona radiata ischemic infarction on MRI 1. Exam reveals left sided facial droop. No limb weakness is noted except for left deltoid 4/5 weakness which patient states is chronic secondary to prior rotator cuff injury.  2. MRI brain (images personally reviewed): Small area of acute infarction in the right corona radiata, which appears to be in a similar but not identical area to the 09/28/2019 infarct. Numerous foci of hemosiderin deposition in the bilateral cerebral hemispheres most prominently in  the left occipital lobe, as well as in the bilateral basal ganglia and thalami. While some of these may reflect the sequela of prior hypertensive microhemorrhages, cerebral amyloid angiopathy could appear similar, particularly with regard to the cerebral microhemorrhages. 3. CTA of head and neck: No intracranial large vessel occlusion. Overall unchanged atherosclerotic disease in the left P1 and P2 and right P3 segments. No hemodynamically significant stenosis in the neck.  4. EKG: Sinus rhythm; Probable left atrial enlargement; Borderline repolarization abnormality; No significant change since last tracing 5. DDx for the foci  of hemosiderin deposition on MRI includes amyloid angiopathy, prior closed head trauma with diffuse shear injury and hypertensive hemorrhages. The latter is felt to be most likely based on the overall pattern of his presentation including the locations of the chronic microhemorrhages, the patient's age and his history of HTN.  6. Stroke risk factors: Prior stroke, HLD, HTN and obesity  Recommendations: 1. HgbA1c, fasting lipid panel 2. TTE 3. PT consult, OT consult, Speech consult 4. Continue Lipitor 40 mg po qd.  5. Adding Plavix to home ASA given failure of monotherapy. Will need to keep BP tightly controlled while on DAPT.  6. Should start to keep a daily BP diary  7. Risk factor modification to include initiation of light exercise. I have recommended daily walks of > 30 minutes duration 8. Telemetry monitoring. Has probable enlarged left atrium based on EKG, which is a risk factor for development of a-fib.  9. Frequent neuro checks 10. NPO until passes stroke swallow screen    Electronically signed: Dr. Kerney Elbe 12/01/2021, 1:47 AM

## 2021-12-01 NOTE — ED Notes (Signed)
ECHO at bedside.

## 2021-12-01 NOTE — ED Notes (Signed)
MRI notified this RN that pt became claustrophobic during MRI. This RN notified MD and ativan was ordered and administered.

## 2021-12-01 NOTE — ED Notes (Signed)
Hospitalist and OT at bedside.

## 2021-12-01 NOTE — Progress Notes (Addendum)
STROKE TEAM PROGRESS NOTE   INTERVAL HISTORY His wife is at the bedside providing support. Patient is alert and oriented. He states his left-sided facial droop improved completely.  MRI brain shows acute right corona radiata lacunar infarct.  He admits to snoring but has never been evaluated for sleep apnoea Vitals:   12/01/21 0830 12/01/21 0900 12/01/21 1100 12/01/21 1130  BP: 123/74 (!) 141/93 129/87 127/86  Pulse: 82 91 86 89  Resp: '16 19 20 16  '$ Temp:      TempSrc:      SpO2: 94% 96% 96% 95%  Weight:      Height:       CBC:  Recent Labs  Lab 11/30/21 1755 12/01/21 0346  WBC 7.7 8.4  NEUTROABS 4.2 4.5  HGB 15.6 15.0  HCT 44.4 42.8  MCV 84.7 85.8  PLT 251 277   Basic Metabolic Panel:  Recent Labs  Lab 11/30/21 1755 12/01/21 0346  NA 139 135  K 3.7 3.4*  CL 104 104  CO2 25 24  GLUCOSE 109* 93  BUN 12 10  CREATININE 0.84 0.81  CALCIUM 9.9 9.0  MG  --  2.2  PHOS  --  3.0    Lipid Panel:  Recent Labs  Lab 12/01/21 0346  CHOL 116  TRIG 121  HDL 34*  CHOLHDL 3.4  VLDL 24  LDLCALC 58    HgbA1c:  Recent Labs  Lab 12/01/21 0346  HGBA1C 5.2      IMAGING past 24 hours CT ANGIO HEAD NECK W WO CM  Result Date: 12/01/2021 CLINICAL DATA:  Facial droop, acute infarct on MRI EXAM: CT ANGIOGRAPHY HEAD AND NECK TECHNIQUE: Multidetector CT imaging of the head and neck was performed using the standard protocol during bolus administration of intravenous contrast. Multiplanar CT image reconstructions and MIPs were obtained to evaluate the vascular anatomy. Carotid stenosis measurements (when applicable) are obtained utilizing NASCET criteria, using the distal internal carotid diameter as the denominator. RADIATION DOSE REDUCTION: This exam was performed according to the departmental dose-optimization program which includes automated exposure control, adjustment of the mA and/or kV according to patient size and/or use of iterative reconstruction technique. CONTRAST:  59m  OMNIPAQUE IOHEXOL 350 MG/ML SOLN COMPARISON:  09/28/2019 CTA head and neck FINDINGS: CT HEAD FINDINGS Brain: No evidence of acute infarction, hemorrhage, cerebral edema, mass, mass effect, or midline shift. No hydrocephalus or extra-axial fluid collection. Lacunar infarct in the left basal ganglia is new compared to the 09/28/2019 CT. Periventricular white matter changes, likely the sequela of chronic small vessel ischemic disease. The recently noted infarct in the right corona radiata is not appreciated on this exam. Vascular: No hyperdense vessel. Skull: Normal. Negative for fracture or focal lesion. Sinuses/Orbits: No acute finding. Other: The mastoid air cells are well aerated. CTA NECK FINDINGS Aortic arch: Standard branching. Imaged portion shows no evidence of aneurysm or dissection. No significant stenosis of the major arch vessel origins. Right carotid system: No evidence of dissection, occlusion, or hemodynamically significant stenosis (greater than 50%). Left carotid system: No evidence of dissection, occlusion, or hemodynamically significant stenosis (greater than 50%). Vertebral arteries: No evidence of dissection, occlusion, or hemodynamically significant stenosis (greater than 50%). Skeleton: No acute osseous abnormality. Other neck: No acute finding. Upper chest: No focal pulmonary opacity or pleural effusion. Review of the MIP images confirms the above findings CTA HEAD FINDINGS Anterior circulation: Both internal carotid arteries are patent to the termini, with mild calcification but without significant stenosis. A1 segments patent.  Normal anterior communicating artery. Anterior cerebral arteries are patent to their distal aspects. No M1 stenosis or occlusion. Normal MCA bifurcations. Distal MCA branches perfused and symmetric. Posterior circulation: Vertebral arteries patent to the vertebrobasilar junction without stenosis. Basilar patent to its distal aspect. Superior cerebellar arteries patent  proximally. Patent P1 segments. Redemonstrated mild left P1 and P2 stenosis, as well as severe stenosis at the origin of the more medial P3 branch. PCAs otherwise perfused to their distal aspects. The bilateral posterior communicating arteries are not visualized. Venous sinuses: As permitted by contrast timing, patent. Anatomic variants: None significant. Review of the MIP images confirms the above findings IMPRESSION: 1. No intracranial large vessel occlusion. Overall unchanged atherosclerotic disease in the left P1 and P2 and right P3 segments. 2.  No hemodynamically significant stenosis in the neck. 3.  No acute intracranial process. Electronically Signed   By: Merilyn Baba M.D.   On: 12/01/2021 04:04   MR BRAIN WO CONTRAST  Result Date: 12/01/2021 CLINICAL DATA:  Facial droop EXAM: MRI HEAD WITHOUT CONTRAST TECHNIQUE: Multiplanar, multiecho pulse sequences of the brain and surrounding structures were obtained without intravenous contrast. COMPARISON:  09/28/2019 MRI FINDINGS: Brain: Small area of restricted diffusion with ADC correlate in the right corona radiata (series 5, images 81-84), which appears to be in a similar distribution to the 09/28/2019 infarct but is slightly larger. No acute hemorrhage, mass, mass effect, or midline shift. No hydrocephalus or extra-axial collection. Foci of hemosiderin deposition in the bilateral cerebral hemispheres, most prominently in the left occipital lobe, as well as in the bilateral basal ganglia and thalami. Lacunar infarcts in the bilateral basal ganglia, corona radiata, and left pons. T2 hyperintense signal in the periventricular white matter, likely the sequela of moderate chronic small vessel ischemic disease. Vascular: Normal flow voids. Skull and upper cervical spine: Normal marrow signal. Sinuses/Orbits: Mild mucosal thickening in the maxillary sinuses, left sphenoid sinus, and ethmoid air cells. The orbits are unremarkable. Other: The mastoids are well  aerated. IMPRESSION: 1. Small area of acute infarction in the right corona radiata, which appears to be in a similar area to the 09/28/2019 infarct. 2. Numerous foci of hemosiderin deposition in the bilateral cerebral hemispheres most prominently in the left occipital lobe, as well as in the bilateral basal ganglia and thalami. While some of these may reflect the sequela of prior hypertensive microhemorrhages, cerebral amyloid angiopathy could appear similar, particularly with regards to the cerebral microhemorrhages. These results were called by telephone at the time of interpretation on 12/01/2021 at 1:41 am to provider [facility] , who verbally acknowledged these results. Electronically Signed   By: Merilyn Baba M.D.   On: 12/01/2021 01:41    PHYSICAL EXAM  Physical Exam  HEENT-  [place]/AT  Lungs- Respirations unlabored Extremities- No edema   Neurological Examination Mental Status: Alert, oriented x 5, thought content appropriate.  Speech fluent without evidence of aphasia. No dysarthria. Able to follow all commands without difficulty. Cranial Nerves: II: Temporal visual fields intact with no extinction to DSS. PERRL  III,IV, VI: No ptosis. EOMI. No nystagmus.  V: Temp sensation equal bilaterally  VII: mild Left facial droop. Left palpebral fissure wider than on the right, which patient states is chronic. Eyelid closure strength is full and equal bilaterally.  patient able to whistle VIII: Hearing intact to voice IX,X: No hoarseness XI: Symmetric shoulder shrug XII: Midline tongue extension Motor: RUE 5/5 proximally and distally LUE 5/5 proximally and distally except for 4/5 left deltoid BLE 5/5  proximally and distally without asymmetry No pronator drift.  Sensory: Temp and light touch intact throughout, bilaterally. No extinction to DSS.  Deep Tendon Reflexes: 2+ and symmetric throughout Cerebellar: No ataxia with FNF or H-S bilaterally  Gait: Deferred  NIHSS 1 Premodbid MRS  0  ASSESSMENT/PLAN Mr. QUASIM DOYON is a 56 y.o. male with history of left pontine stroke and right corona radiata/posterior lentiform stroke (4/21), HLD, HTN, obesity and chronic prostatitis who presented to the [place] ED on Sunday evening with complaints of left sided facial droop that had been first noticed on Saturday morning at 1000 upon waking. LKN was the night prior. He denied any speech difficulty or limb weakness at that time. He was sent to the [facility] ED where MRI brain revealed an acute right corona radiata ischemic infarction as well as multiple chronic foci of hemosiderin deposition concerning for  hypertensive hemorrhages.   Admitting BP was 161/96.    On bedside interview, the patient denies any neurological symptoms other than his persisting left facial droop, including no dysphasia, dysarthria, limb weakness, limb numbness, incoordination, face numbness, ataxia or difficulty walking.   MRI brain demonstrates a small area of acute infarction in the right corona radiata, which appears to be in a similar area to the 4/21 infarct.   Stroke:  right corona radiata infarct   likely secondary small vessel disease CTA head & neck:   1. No intracranial large vessel occlusion. Overall unchanged atherosclerotic disease in the left P1 and P2 and right P3 segments. 2.  No hemodynamically significant stenosis in the neck. 3.  No acute intracranial process.     MRI Brain:  1. Small area of acute infarction in the right corona radiata, which appears to be in a similar area to the 09/28/2019 infarct. 2. Numerous foci of hemosiderin deposition in the bilateral cerebral hemispheres most prominently in the left occipital lobe, as well as in the bilateral basal ganglia and thalami. While some of these may reflect the sequela of prior hypertensive microhemorrhages, cerebral amyloid angiopathy could appear similar, particularly with regards to the cerebral microhemorrhages.   2D  Echo pending   LDL 58 HgbA1c 5.2 VTE prophylaxis - scd    Diet   Diet heart healthy/carb modified Room service appropriate? Yes; Fluid consistency: Thin   aspirin 81 mg daily prior to admission, now on aspirin 81 mg daily and clopidogrel 75 mg daily for 3 weeks then followed by plavix '75mg'$  alone daily Therapy recommendations:    SLP: no follow up planned  PT: no follow up planned OT: no follow up planned Disposition:  home   Hypertension Home meds:  amlodipine '5mg'$  daily  Stable Permissive hypertension (OK if < 220/120) but gradually normalize in 5-7 days Long-term BP goal normotensive  Hyperlipidemia Home meds:   none, lipitor '40mg'$  started in hospital LDL 58, goal < 7-   Continue statin at discharge  HgbA1c 5.2, goal < 7.0 CBGs Recent Labs    11/30/21 1713  GLUCAP 100*    SSI  Other Stroke Risk Factors   Obesity, Body mass index is 32.08 kg/m., BMI >/= 30 associated with increased stroke risk, recommend weight loss, diet and exercise as appropriate   Hx stroke/TIA see above re: 09/2019  Other Active Problems Snoring: consider testing for sleep apnea   Hospital day # 0   I have personally obtained history,examined this patient, reviewed notes, independently viewed imaging studies, participated in medical decision making and plan of care.ROS completed by me  personally and pertinent positives fully documented  I have made any additions or clarifications directly to the above note. Agree with note above. Patient presented with transient left facial droop due to right corona radiata infarct due to small vessel disease.  Recommend aspirin and Plavix for 3 weeks followed by Plavix alone and aggressive risk factor modification.  Patient also appears to be at risk for obstructive sleep apnea and is interested in participating in the sleep smart stroke prevention study.  He and his wife agree information to review and decide.  If his signs consent form he may undergo the NOx 3  monitor screening for sleep apnea Greater than 50% time during this 50-minute visit was spent in counseling and coordination of care about his lacunar stroke and discussion about sleep apnea and stroke prevention and answering questions.  Discussed with Dr. Waylan Rocher, MD Medical Director [place] Pager: [phone] 12/01/2021 4:43 PM   To contact Stroke Continuity provider, please refer to [email or web address] After hours, contact General Neurology

## 2021-12-01 NOTE — Progress Notes (Signed)
PT Cancellation Note  Patient Details Name: Dylan Fuller MRN: [id number] DOB: 1965-12-16   Cancelled Treatment:    Reason Eval/Treat Not Completed: PT screened, no needs identified, will sign off. Pt independent with all mobility per OT.   Shary Decamp [facility] [address] 12/01/2021, 10:21 AM [place] Office [phone]

## 2021-12-01 NOTE — Progress Notes (Signed)
OT Cancellation Note  Patient Details Name: Dylan Fuller MRN: [id number] DOB: 1966/01/12   Cancelled Treatment:    Reason Eval/Treat Not Completed: Patient at procedure or test/ unavailable (ECHO. Will return as schedule allows.)  [place], OTR/L Acute Rehab Office: [phone] 12/01/2021, 8:40 AM

## 2021-12-01 NOTE — ED Notes (Signed)
Pt continues to have L side facial droop.  No difficulty taking medications w/ water.

## 2021-12-01 NOTE — ED Notes (Signed)
Patient transported to MRI 

## 2021-12-01 NOTE — Care Plan (Addendum)
This 56 years old male with PMH significant for prior CVA 2 years ago on aspirin 81 mg and Lipitor 40 mg, obesity, essential hypertension presented in the ED with complaints of persistent left-sided facial droop with onset on 11/29/2021.  Noncontrast MRI revealed acute CVA involving small area of acute infarction in the right corona radiata.  Neurology/stroke team was consulted.  Patient was admitted for CVA work-up. CTA head and neck without large vessel occlusion.  Allow permissive hypertension.  2D echocardiogram pending.  LDL 58, well under goal.  Hemoglobin A1c 5.2.  PT and OT recommended no PT needs. Patient was seen and examined at bedside, he is still has left-sided facial droop, reports feeling better. Neurology recommended dual antiplatelet therapy for 3 weeks followed by Plavix only.  Patient is interested in sleep study for CPAP trial.

## 2021-12-01 NOTE — ED Notes (Signed)
Lunch order placed

## 2021-12-02 DIAGNOSIS — E785 Hyperlipidemia, unspecified: Secondary | ICD-10-CM | POA: Diagnosis present

## 2021-12-02 DIAGNOSIS — Z6832 Body mass index (BMI) 32.0-32.9, adult: Secondary | ICD-10-CM | POA: Diagnosis not present

## 2021-12-02 DIAGNOSIS — I69392 Facial weakness following cerebral infarction: Secondary | ICD-10-CM | POA: Diagnosis not present

## 2021-12-02 DIAGNOSIS — G4733 Obstructive sleep apnea (adult) (pediatric): Secondary | ICD-10-CM | POA: Diagnosis present

## 2021-12-02 DIAGNOSIS — I6381 Other cerebral infarction due to occlusion or stenosis of small artery: Secondary | ICD-10-CM | POA: Diagnosis present

## 2021-12-02 DIAGNOSIS — I639 Cerebral infarction, unspecified: Secondary | ICD-10-CM | POA: Diagnosis not present

## 2021-12-02 DIAGNOSIS — Z83438 Family history of other disorder of lipoprotein metabolism and other lipidemia: Secondary | ICD-10-CM | POA: Diagnosis not present

## 2021-12-02 DIAGNOSIS — Z8249 Family history of ischemic heart disease and other diseases of the circulatory system: Secondary | ICD-10-CM | POA: Diagnosis not present

## 2021-12-02 DIAGNOSIS — Z79899 Other long term (current) drug therapy: Secondary | ICD-10-CM | POA: Diagnosis not present

## 2021-12-02 DIAGNOSIS — I10 Essential (primary) hypertension: Secondary | ICD-10-CM | POA: Diagnosis present

## 2021-12-02 DIAGNOSIS — Z7982 Long term (current) use of aspirin: Secondary | ICD-10-CM | POA: Diagnosis not present

## 2021-12-02 DIAGNOSIS — R29701 NIHSS score 1: Secondary | ICD-10-CM | POA: Diagnosis present

## 2021-12-02 DIAGNOSIS — E876 Hypokalemia: Secondary | ICD-10-CM | POA: Diagnosis not present

## 2021-12-02 DIAGNOSIS — R29810 Facial weakness: Secondary | ICD-10-CM | POA: Diagnosis present

## 2021-12-02 DIAGNOSIS — E669 Obesity, unspecified: Secondary | ICD-10-CM | POA: Diagnosis present

## 2021-12-02 DIAGNOSIS — N411 Chronic prostatitis: Secondary | ICD-10-CM | POA: Diagnosis present

## 2021-12-02 NOTE — Progress Notes (Signed)
STROKE TEAM PROGRESS NOTE   INTERVAL HISTORY No family at the bedside today.  Patient is alert and oriented. He states his left-sided facial droop improved almost completely.  He did sign consent to participate in the sleep smart study and had overnight NOx 3 monitor but the results are yet pending Vitals:   12/02/21 0131 12/02/21 0458 12/02/21 0840 12/02/21 1206  BP: (!) 126/97 (!) 126/92 (!) 128/92 128/88  Pulse: 88 82 85 86  Resp: '20 20 15 '$ (!) 22  Temp: 98.2 F (36.8 C) 98 F (36.7 C) 97.7 F (36.5 C) 97.8 F (36.6 C)  TempSrc: Oral Oral Oral Oral  SpO2: 93% 93% 96% 95%  Weight:      Height:       CBC:  Recent Labs  Lab 11/30/21 1755 12/01/21 0346  WBC 7.7 8.4  NEUTROABS 4.2 4.5  HGB 15.6 15.0  HCT 44.4 42.8  MCV 84.7 85.8  PLT 251 174   Basic Metabolic Panel:  Recent Labs  Lab 11/30/21 1755 12/01/21 0346  NA 139 135  K 3.7 3.4*  CL 104 104  CO2 25 24  GLUCOSE 109* 93  BUN 12 10  CREATININE 0.84 0.81  CALCIUM 9.9 9.0  MG  --  2.2  PHOS  --  3.0    Lipid Panel:  Recent Labs  Lab 12/01/21 0346  CHOL 116  TRIG 121  HDL 34*  CHOLHDL 3.4  VLDL 24  LDLCALC 58    HgbA1c:  Recent Labs  Lab 12/01/21 0346  HGBA1C 5.2      IMAGING past 24 hours No results found.  PHYSICAL EXAM  Physical Exam  HEENT-  [place]/AT  Lungs- Respirations unlabored Extremities- No edema   Neurological Examination Mental Status: Alert, oriented x 5, thought content appropriate.  Speech fluent without evidence of aphasia. No dysarthria. Able to follow all commands without difficulty. Cranial Nerves: II: Temporal visual fields intact with no extinction to DSS. PERRL  III,IV, VI: No ptosis. EOMI. No nystagmus.  V: Temp sensation equal bilaterally  VII: mild Left facial droop. Left palpebral fissure wider than on the right, which patient states is chronic. Eyelid closure strength is full and equal bilaterally.  patient able to whistle VIII: Hearing intact to voice IX,X: No  hoarseness XI: Symmetric shoulder shrug XII: Midline tongue extension Motor: RUE 5/5 proximally and distally LUE 5/5 proximally and distally except for 4/5 left deltoid BLE 5/5 proximally and distally without asymmetry No pronator drift.  Sensory: Temp and light touch intact throughout, bilaterally. No extinction to DSS.  Deep Tendon Reflexes: 2+ and symmetric throughout Cerebellar: No ataxia with FNF or H-S bilaterally  Gait: Deferred  NIHSS 1 Premodbid MRS 0  ASSESSMENT/PLAN Mr. TEJA JUDICE is a 56 y.o. male with history of left pontine stroke and right corona radiata/posterior lentiform stroke (4/21), HLD, HTN, obesity and chronic prostatitis who presented to the [facility] ED on Sunday evening with complaints of left sided facial droop that had been first noticed on Saturday morning at 1000 upon waking. LKN was the night prior. He denied any speech difficulty or limb weakness at that time. He was sent to the [facility] ED where MRI brain revealed an acute right corona radiata ischemic infarction as well as multiple chronic foci of hemosiderin deposition concerning for  hypertensive hemorrhages.   Admitting BP was 161/96.    On bedside interview, the patient denies any neurological symptoms other than his persisting left facial droop, including no dysphasia, dysarthria,  limb weakness, limb numbness, incoordination, face numbness, ataxia or difficulty walking.   MRI brain demonstrates a small area of acute infarction in the right corona radiata, which appears to be in a similar area to the 4/21 infarct.   Stroke:  right corona radiata infarct   likely secondary small vessel disease CTA head & neck:   1. No intracranial large vessel occlusion. Overall unchanged atherosclerotic disease in the left P1 and P2 and right P3 segments. 2.  No hemodynamically significant stenosis in the neck. 3.  No acute intracranial process.     MRI Brain:  1. Small area of acute infarction in the  right corona radiata, which appears to be in a similar area to the 09/28/2019 infarct. 2. Numerous foci of hemosiderin deposition in the bilateral cerebral hemispheres most prominently in the left occipital lobe, as well as in the bilateral basal ganglia and thalami. While some of these may reflect the sequela of prior hypertensive microhemorrhages, cerebral amyloid angiopathy could appear similar, particularly with regards to the cerebral microhemorrhages.   2D Echo EF 60 to 65%. LDL 58 HgbA1c 5.2 VTE prophylaxis - scd    Diet   Diet heart healthy/carb modified Room service appropriate? Yes; Fluid consistency: Thin   aspirin 81 mg daily prior to admission, now on aspirin 81 mg daily and clopidogrel 75 mg daily for 3 weeks then followed by plavix '75mg'$  alone daily Therapy recommendations:    SLP: no follow up planned  PT: no follow up planned OT: no follow up planned Disposition:  home   Hypertension Home meds:  amlodipine '5mg'$  daily  Stable Permissive hypertension (OK if < 220/120) but gradually normalize in 5-7 days Long-term BP goal normotensive  Hyperlipidemia Home meds:   none, lipitor '40mg'$  started in hospital LDL 58, goal < 7-   Continue statin at discharge  HgbA1c 5.2, goal < 7.0 CBGs Recent Labs    11/30/21 1713  GLUCAP 100*    SSI  Other Stroke Risk Factors   Obesity, Body mass index is 32.08 kg/m., BMI >/= 30 associated with increased stroke risk, recommend weight loss, diet and exercise as appropriate   Hx stroke/TIA see above re: 09/2019  Other Active Problems Snoring: consider testing for sleep apnea   Hospital day # 0  Patient presented with transient left facial droop due to right corona radiata infarct due to small vessel disease.  Recommend aspirin and Plavix for 3 weeks followed by Plavix alone and aggressive risk factor modification.  Patient also appears to be at risk for obstructive sleep apnea and is  participating in the sleep smart stroke  prevention study.  He had overnight NOx 3 monitor for screening but the results are yet awaited.  Greater than 50% time during this 35 minute visit was spent in counseling and coordination of care about his lacunar stroke and discussion about sleep apnea and stroke prevention and answering questions.  Discussed with Dr. Waylan Rocher, MD Medical Director [place] Pager: [phone] 12/02/2021 1:39 PM   To contact Stroke Continuity provider, please refer to [email or web address] After hours, contact General Neurology

## 2021-12-02 NOTE — Plan of Care (Signed)
Pt doing well no concerns noted. Problem: Education: Goal: Knowledge of General Education information will improve Description: Including pain rating scale, medication(s)/side effects and non-pharmacologic comfort measures Outcome: Progressing   Problem: Health Behavior/Discharge Planning: Goal: Ability to manage health-related needs will improve Outcome: Progressing   Problem: Clinical Measurements: Goal: Ability to maintain clinical measurements within normal limits will improve Outcome: Progressing Goal: Will remain free from infection Outcome: Progressing Goal: Diagnostic test results will improve Outcome: Progressing Goal: Respiratory complications will improve Outcome: Progressing Goal: Cardiovascular complication will be avoided Outcome: Progressing   Problem: Activity: Goal: Risk for activity intolerance will decrease Outcome: Progressing   Problem: Nutrition: Goal: Adequate nutrition will be maintained Outcome: Progressing   Problem: Coping: Goal: Level of anxiety will decrease Outcome: Progressing   Problem: Elimination: Goal: Will not experience complications related to bowel motility Outcome: Progressing Goal: Will not experience complications related to urinary retention Outcome: Progressing   Problem: Pain Managment: Goal: General experience of comfort will improve Outcome: Progressing   Problem: Safety: Goal: Ability to remain free from injury will improve Outcome: Progressing   Problem: Skin Integrity: Goal: Risk for impaired skin integrity will decrease Outcome: Progressing

## 2021-12-02 NOTE — TOC CAGE-AID Note (Addendum)
Transition of Care [facility]) - CAGE-AID Screening   Patient Details  Name: Dylan Fuller MRN: [id number] Date of Birth: Mar 22, 1966  Transition of Care [facility]) CM/SW Contact:    Coralee Pesa, [place] Phone Number: 12/02/2021, 12:08 PM   Clinical Narrative: CSW met with pt at bedside to complete CAGE- Aid assessment. Pt notes he drinks socially. Declines need for resources.   CAGE-AID Screening:    Have You Ever Felt You Ought to Cut Down on Your Drinking or Drug Use?: No Have People Annoyed You By Critizing Your Drinking Or Drug Use?: No Have You Felt Bad Or Guilty About Your Drinking Or Drug Use?: No Have You Ever Had a Drink or Used Drugs First Thing In The Morning to Steady Your Nerves or to Get Rid of a Hangover?: No CAGE-AID Score: 0  Substance Abuse Education Offered: Yes

## 2021-12-02 NOTE — Progress Notes (Signed)
PROGRESS NOTE    Dylan Fuller  EXH:[id number] DOB: 1965/10/03 DOA: 11/30/2021  PCP: Dylan Fuller, Dylan Halsted, MD   Brief Narrative:  This 56 years old male with PMH significant for prior CVA 2 years ago on aspirin 81 mg and Lipitor 40 mg, obesity, essential hypertension presented in the ED with complaints of persistent left-sided facial droop with onset on 11/29/2021. Non contrast MRI revealed acute CVA involving small area of acute infarction in the right corona radiata.  Neurology/stroke team was consulted.  Patient was admitted for CVA work-up. CTA head and neck without large vessel occlusion.  Allow permissive hypertension.  2D echocardiogram pending.  LDL 58, well under goal.  Hemoglobin A1c 5.2.  PT and OT recommended no PT needs. Neurology recommended dual antiplatelet therapy for 3 weeks followed by Plavix only.  Patient is interested in sleep study for CPAP trial.  Patient is positive for sleep apnea,  Patient will stay overnight stay for calibration.  Assessment & Plan:   Principal Problem:   CVA (cerebral vascular accident) ([place])  Acute CVA, recurrent : Patient presented with left facial droop.   LKW 11/29/21 around 10 AM. MRI brain revealed acute CVA . CTA head and neck with and without contrast to rule out LVO 2D echo shows LVEF 60 to 65% no RWMA Frequent neuro-checks Telemetry monitoring to rule out arrhythmia LDL well under control 58.  Hemoglobin A1c 5.2 PT recommended no PT needs. Neurology recommended dual antiplatelet therapy for 3 weeks followed by Plavix only.  Essential hypertension: Allow permissive hypertension Treat SBP greater than 220 or DBP greater than 120 Continue to hold off home Norvasc 5 mg daily Closely monitor vital signs   Obesity: BMI 32 Diet and exercise discussed in detail. At risk for OSA  Obstructive sleep apnea: Patient underwent sleep studies which is positive for OSA. Neurology recommended overnight stay for calibration for  CPAP.   DVT prophylaxis: SCDs Code Status: Full code. Family Communication: No family at bed side. Disposition Plan:   Status is: Observation The patient remains OBS appropriate and will d/c before 2 midnights.  Admitted for acute CVA underwent sleep studies which is positive for sleep apnea neurology recommended overnight stay for calibration for CPAP. Anticipated discharge home 12/03/2021.   Consultants:  Neurology  Procedures: Echocardiogram, MRI, sleep study Antimicrobials: None  Subjective: Patient was seen and examined at bedside.  Overnight events noted.   Patient reports feeling much improved.   Patient is going to have sleep studies which came back positive for sleep apnea.  Objective: Vitals:   12/02/21 0131 12/02/21 0458 12/02/21 0840 12/02/21 1206  BP: (!) 126/97 (!) 126/92 (!) 128/92 128/88  Pulse: 88 82 85 86  Resp: '20 20 15 '$ (!) 22  Temp: 98.2 F (36.8 C) 98 F (36.7 C) 97.7 F (36.5 C) 97.8 F (36.6 C)  TempSrc: Oral Oral Oral Oral  SpO2: 93% 93% 96% 95%  Weight:      Height:        Intake/Output Summary (Last 24 hours) at 12/02/2021 1453 Last data filed at 12/01/2021 1800 Gross per 24 hour  Intake 785.02 ml  Output --  Net 785.02 ml   Filed Weights   11/30/21 1711  Weight: 104.3 kg    Examination:  General exam: Appears comfortable, not in any acute distress.  Left-sided facial droop Respiratory system: CTA bilaterally, no wheezing, no crackles, normal respiratory effort. Cardiovascular system: S1 S2 heard, regular rate and rhythm, no murmur. Gastrointestinal system: Abdomen is  soft, non tender, non distended, BS+ Central nervous system: Alert and oriented X 3. No focal neurological deficits. Extremities: No edema, no cyanosis, no clubbing. Skin: No rashes, lesions or ulcers Psychiatry: Judgement and insight appear normal. Mood & affect appropriate.     Data Reviewed: I have personally reviewed following labs and imaging  studies  CBC: Recent Labs  Lab 11/30/21 1755 12/01/21 0346  WBC 7.7 8.4  NEUTROABS 4.2 4.5  HGB 15.6 15.0  HCT 44.4 42.8  MCV 84.7 85.8  PLT 251 324   Basic Metabolic Panel: Recent Labs  Lab 11/30/21 1755 12/01/21 0346  NA 139 135  K 3.7 3.4*  CL 104 104  CO2 25 24  GLUCOSE 109* 93  BUN 12 10  CREATININE 0.84 0.81  CALCIUM 9.9 9.0  MG  --  2.2  PHOS  --  3.0   GFR: Estimated Creatinine Clearance: 123.7 mL/min (by C-G formula based on SCr of 0.81 mg/dL). Liver Function Tests: Recent Labs  Lab 11/30/21 1755 12/01/21 0346  AST 24 28  ALT 46* 46*  ALKPHOS 63 64  BILITOT 0.7 1.0  PROT 7.4 6.6  ALBUMIN 4.7 3.8   No results for input(s): "LIPASE", "AMYLASE" in the last 168 hours. No results for input(s): "AMMONIA" in the last 168 hours. Coagulation Profile: No results for input(s): "INR", "PROTIME" in the last 168 hours. Cardiac Enzymes: No results for input(s): "CKTOTAL", "CKMB", "CKMBINDEX", "TROPONINI" in the last 168 hours. BNP (last 3 results) No results for input(s): "PROBNP" in the last 8760 hours. HbA1C: Recent Labs    12/01/21 0346  HGBA1C 5.2   CBG: Recent Labs  Lab 11/30/21 1713  GLUCAP 100*   Lipid Profile: Recent Labs    12/01/21 0346  CHOL 116  HDL 34*  LDLCALC 58  TRIG 121  CHOLHDL 3.4   Thyroid Function Tests: No results for input(s): "TSH", "T4TOTAL", "FREET4", "T3FREE", "THYROIDAB" in the last 72 hours. Anemia Panel: No results for input(s): "VITAMINB12", "FOLATE", "FERRITIN", "TIBC", "IRON", "RETICCTPCT" in the last 72 hours. Sepsis Labs: No results for input(s): "PROCALCITON", "LATICACIDVEN" in the last 168 hours.  No results found for this or any previous visit (from the past 240 hour(s)).   Radiology Studies: ECHOCARDIOGRAM COMPLETE BUBBLE STUDY  Result Date: 12/01/2021    ECHOCARDIOGRAM REPORT   Patient Name:   Dylan Fuller Date of Exam: 12/01/2021 Medical Rec #:  [id number]       Height:       71.0 in Accession #:     [id number]      Weight:       230.0 lb Date of Birth:  1966-05-07       BSA:          2.238 m Patient Age:    17 years        BP:           123/74 mmHg Patient Gender: M               HR:           81 bpm. Exam Location:  Inpatient Procedure: 2D Echo, Cardiac Doppler, Color Doppler and Saline Contrast Bubble            Study Indications:     Stroke  History:         Patient has prior history of Echocardiogram examinations, most                  recent 09/28/2019. Risk  Factors:Hypertension.  Sonographer:     Jefferey Pica Referring Phys:  [id number] [place] Diagnosing Phys: Glori Bickers MD IMPRESSIONS  1. Left ventricular ejection fraction, by estimation, is 60 to 65%. The left ventricle has normal function. The left ventricle has no regional wall motion abnormalities. Left ventricular diastolic parameters are consistent with Grade I diastolic dysfunction (impaired relaxation).  2. Right ventricular systolic function is normal. The right ventricular size is normal. There is normal pulmonary artery systolic pressure.  3. Left atrial size was mildly dilated.  4. The mitral valve is normal in structure. Trivial mitral valve regurgitation. No evidence of mitral stenosis.  5. The aortic valve is normal in structure. Aortic valve regurgitation is not visualized. No aortic stenosis is present.  6. Aortic dilatation noted. There is borderline dilatation of the ascending aorta, measuring 38 mm. There is moderate dilatation of the aortic root, measuring 45 mm.  7. The inferior vena cava is normal in size with greater than 50% respiratory variability, suggesting right atrial pressure of 3 mmHg.  8. Agitated saline contrast bubble study was negative, with no evidence of any interatrial shunt. FINDINGS  Left Ventricle: Left ventricular ejection fraction, by estimation, is 60 to 65%. The left ventricle has normal function. The left ventricle has no regional wall motion abnormalities. The left ventricular internal  cavity size was normal in size. There is  no left ventricular hypertrophy. Left ventricular diastolic parameters are consistent with Grade I diastolic dysfunction (impaired relaxation). Right Ventricle: The right ventricular size is normal. No increase in right ventricular wall thickness. Right ventricular systolic function is normal. There is normal pulmonary artery systolic pressure. The tricuspid regurgitant velocity is 1.65 m/s, and  with an assumed right atrial pressure of 5 mmHg, the estimated right ventricular systolic pressure is 14.7 mmHg. Left Atrium: Left atrial size was mildly dilated. Right Atrium: Right atrial size was normal in size. Pericardium: There is no evidence of pericardial effusion. Mitral Valve: The mitral valve is normal in structure. Trivial mitral valve regurgitation. No evidence of mitral valve stenosis. Tricuspid Valve: The tricuspid valve is normal in structure. Tricuspid valve regurgitation is trivial. No evidence of tricuspid stenosis. Aortic Valve: The aortic valve is normal in structure. Aortic valve regurgitation is not visualized. No aortic stenosis is present. Aortic valve peak gradient measures 2.8 mmHg. Pulmonic Valve: The pulmonic valve was normal in structure. Pulmonic valve regurgitation is not visualized. No evidence of pulmonic stenosis. Aorta: The aortic root is normal in size and structure and aortic dilatation noted. There is borderline dilatation of the ascending aorta, measuring 38 mm. There is moderate dilatation of the aortic root, measuring 45 mm. Venous: The inferior vena cava is normal in size with greater than 50% respiratory variability, suggesting right atrial pressure of 3 mmHg. IAS/Shunts: No atrial level shunt detected by color flow Doppler. Agitated saline contrast was given intravenously to evaluate for intracardiac shunting. Agitated saline contrast bubble study was negative, with no evidence of any interatrial shunt.  LEFT VENTRICLE PLAX 2D LVIDd:          4.40 cm   Diastology LVIDs:         2.60 cm   LV e' medial:    6.05 cm/s LV PW:         1.20 cm   LV E/e' medial:  10.0 LV IVS:        1.00 cm   LV e' lateral:   7.82 cm/s LVOT diam:     2.60  cm   LV E/e' lateral: 7.7 LV SV:         66 LV SV Index:   29 LVOT Area:     5.31 cm  RIGHT VENTRICLE            IVC RV S prime:     8.83 cm/s  IVC diam: 1.50 cm TAPSE (M-mode): 1.4 cm LEFT ATRIUM             Index LA diam:        3.90 cm 1.74 cm/m LA Vol (A2C):   57.9 ml 25.88 ml/m LA Vol (A4C):   73.9 ml 33.03 ml/m LA Biplane Vol: 67.7 ml 30.26 ml/m  AORTIC VALVE                 PULMONIC VALVE AV Area (Vmax): 4.27 cm     PV Vmax:       0.56 m/s AV Vmax:        83.85 cm/s   PV Peak grad:  1.3 mmHg AV Peak Grad:   2.8 mmHg LVOT Vmax:      67.40 cm/s LVOT Vmean:     42.200 cm/s LVOT VTI:       0.124 m  AORTA Ao Root diam: 4.50 cm Ao Asc diam:  3.80 cm MITRAL VALVE               TRICUSPID VALVE MV Area (PHT): 3.21 cm    TR Peak grad:   10.9 mmHg MV Decel Time: 236 msec    TR Vmax:        165.00 cm/s MV E velocity: 60.30 cm/s MV A velocity: 61.70 cm/s  SHUNTS MV E/A ratio:  0.98        Systemic VTI:  0.12 m                            Systemic Diam: 2.60 cm Glori Bickers MD Electronically signed by Glori Bickers MD Signature Date/Time: 12/01/2021/4:30:05 PM    Final (Updated)    CT ANGIO HEAD NECK W WO CM  Result Date: 12/01/2021 CLINICAL DATA:  Facial droop, acute infarct on MRI EXAM: CT ANGIOGRAPHY HEAD AND NECK TECHNIQUE: Multidetector CT imaging of the head and neck was performed using the standard protocol during bolus administration of intravenous contrast. Multiplanar CT image reconstructions and MIPs were obtained to evaluate the vascular anatomy. Carotid stenosis measurements (when applicable) are obtained utilizing NASCET criteria, using the distal internal carotid diameter as the denominator. RADIATION DOSE REDUCTION: This exam was performed according to the departmental dose-optimization program which  includes automated exposure control, adjustment of the mA and/or kV according to patient size and/or use of iterative reconstruction technique. CONTRAST:  53m OMNIPAQUE IOHEXOL 350 MG/ML SOLN COMPARISON:  09/28/2019 CTA head and neck FINDINGS: CT HEAD FINDINGS Brain: No evidence of acute infarction, hemorrhage, cerebral edema, mass, mass effect, or midline shift. No hydrocephalus or extra-axial fluid collection. Lacunar infarct in the left basal ganglia is new compared to the 09/28/2019 CT. Periventricular white matter changes, likely the sequela of chronic small vessel ischemic disease. The recently noted infarct in the right corona radiata is not appreciated on this exam. Vascular: No hyperdense vessel. Skull: Normal. Negative for fracture or focal lesion. Sinuses/Orbits: No acute finding. Other: The mastoid air cells are well aerated. CTA NECK FINDINGS Aortic arch: Standard branching. Imaged portion shows no evidence of aneurysm or dissection. No significant stenosis of the major arch  vessel origins. Right carotid system: No evidence of dissection, occlusion, or hemodynamically significant stenosis (greater than 50%). Left carotid system: No evidence of dissection, occlusion, or hemodynamically significant stenosis (greater than 50%). Vertebral arteries: No evidence of dissection, occlusion, or hemodynamically significant stenosis (greater than 50%). Skeleton: No acute osseous abnormality. Other neck: No acute finding. Upper chest: No focal pulmonary opacity or pleural effusion. Review of the MIP images confirms the above findings CTA HEAD FINDINGS Anterior circulation: Both internal carotid arteries are patent to the termini, with mild calcification but without significant stenosis. A1 segments patent. Normal anterior communicating artery. Anterior cerebral arteries are patent to their distal aspects. No M1 stenosis or occlusion. Normal MCA bifurcations. Distal MCA branches perfused and symmetric. Posterior  circulation: Vertebral arteries patent to the vertebrobasilar junction without stenosis. Basilar patent to its distal aspect. Superior cerebellar arteries patent proximally. Patent P1 segments. Redemonstrated mild left P1 and P2 stenosis, as well as severe stenosis at the origin of the more medial P3 branch. PCAs otherwise perfused to their distal aspects. The bilateral posterior communicating arteries are not visualized. Venous sinuses: As permitted by contrast timing, patent. Anatomic variants: None significant. Review of the MIP images confirms the above findings IMPRESSION: 1. No intracranial large vessel occlusion. Overall unchanged atherosclerotic disease in the left P1 and P2 and right P3 segments. 2.  No hemodynamically significant stenosis in the neck. 3.  No acute intracranial process. Electronically Signed   By: Merilyn Baba M.D.   On: 12/01/2021 04:04   MR BRAIN WO CONTRAST  Result Date: 12/01/2021 CLINICAL DATA:  Facial droop EXAM: MRI HEAD WITHOUT CONTRAST TECHNIQUE: Multiplanar, multiecho pulse sequences of the brain and surrounding structures were obtained without intravenous contrast. COMPARISON:  09/28/2019 MRI FINDINGS: Brain: Small area of restricted diffusion with ADC correlate in the right corona radiata (series 5, images 81-84), which appears to be in a similar distribution to the 09/28/2019 infarct but is slightly larger. No acute hemorrhage, mass, mass effect, or midline shift. No hydrocephalus or extra-axial collection. Foci of hemosiderin deposition in the bilateral cerebral hemispheres, most prominently in the left occipital lobe, as well as in the bilateral basal ganglia and thalami. Lacunar infarcts in the bilateral basal ganglia, corona radiata, and left pons. T2 hyperintense signal in the periventricular white matter, likely the sequela of moderate chronic small vessel ischemic disease. Vascular: Normal flow voids. Skull and upper cervical spine: Normal marrow signal.  Sinuses/Orbits: Mild mucosal thickening in the maxillary sinuses, left sphenoid sinus, and ethmoid air cells. The orbits are unremarkable. Other: The mastoids are well aerated. IMPRESSION: 1. Small area of acute infarction in the right corona radiata, which appears to be in a similar area to the 09/28/2019 infarct. 2. Numerous foci of hemosiderin deposition in the bilateral cerebral hemispheres most prominently in the left occipital lobe, as well as in the bilateral basal ganglia and thalami. While some of these may reflect the sequela of prior hypertensive microhemorrhages, cerebral amyloid angiopathy could appear similar, particularly with regards to the cerebral microhemorrhages. These results were called by telephone at the time of interpretation on 12/01/2021 at 1:41 am to provider [facility] , who verbally acknowledged these results. Electronically Signed   By: Merilyn Baba M.D.   On: 12/01/2021 01:41     Scheduled Meds:  aspirin EC  81 mg Oral Daily   atorvastatin  40 mg Oral Daily   clopidogrel  75 mg Oral Daily   Continuous Infusions:   LOS: 0 days    Time  spent: 50 mins    Shawna Clamp, MD [facility]   If 7PM-7AM, please contact night-coverage

## 2021-12-02 NOTE — Progress Notes (Signed)
  Transition of Care [facility]) Screening Note   Patient Details  Name: Dylan Fuller Date of Birth: October 07, 1965   [facility]) CM/SW Contact:    Pollie Friar, RN Phone Number: 12/02/2021, 3:39 PM    Transition of Care Department [facility]) has reviewed patient and no TOC needs have been identified at this time. We will continue to monitor patient advancement through interdisciplinary progression rounds. If new patient transition needs arise, please place a TOC consult.

## 2021-12-02 NOTE — Plan of Care (Signed)

## 2021-12-03 DIAGNOSIS — I639 Cerebral infarction, unspecified: Secondary | ICD-10-CM | POA: Diagnosis not present

## 2021-12-03 LAB — HIV ANTIBODY (ROUTINE TESTING W REFLEX): HIV Screen 4th Generation wRfx: NONREACTIVE

## 2021-12-03 MED ORDER — CLOPIDOGREL BISULFATE 75 MG PO TABS
75.0000 mg | ORAL_TABLET | Freq: Every day | ORAL | 3 refills | Status: DC
Start: 2021-12-04 — End: 2022-02-19

## 2021-12-03 NOTE — Progress Notes (Signed)
Education given. No questions or concerns. Packet given to pt. Cell phone and clothes with patient.

## 2021-12-03 NOTE — TOC Transition Note (Signed)
Transition of Care [facility]) - CM/SW Discharge Note   Patient Details  Name: Dylan Fuller MRN: [id number] Date of Birth: 03/31/1966  Transition of Care [facility]) CM/SW Contact:  Pollie Friar, RN Phone Number: 12/03/2021, 1:13 PM   Clinical Narrative:    Patient is discharging home with self care. No f/u per PT/OT.  No TOC needs.    Final next level of care: Home/Self Care Barriers to Discharge: No Barriers Identified   Patient Goals and CMS Choice        Discharge Placement                       Discharge Plan and Services                                     Social Determinants of Health (SDOH) Interventions     Readmission Risk Interventions     No data to display

## 2021-12-03 NOTE — Discharge Summary (Signed)
Physician Discharge Summary  Dylan Fuller LOV:[id number] DOB: Nov 06, 1965 DOA: 11/30/2021  PCP: Isaac Bliss, Rayford Halsted, MD  Admit date: 11/30/2021  Discharge date: 12/03/2021  Admitted From: Home  Disposition:  Home  Recommendations for Outpatient Follow-up:  Follow up with PCP in 1-2 weeks. Please obtain BMP/CBC in one week Advised to follow up Neurology as scheduled. Advised to continue dual antiplatelet therapy (aspirin+Plavix) for 3 weeks followed by Plavix only.  Home Health:None Equipment/Devices:None  Discharge Condition: Stable CODE STATUS:Full code Diet recommendation: Heart Healthy  Brief [facility] Course: This 56 years old male with PMH significant for prior CVA 2 years ago on aspirin 81 mg and Lipitor 40 mg, obesity, essential hypertension presented in the ED with complaints of persistent left-sided facial droop with onset on 11/29/2021. Non contrast MRI revealed acute CVA involving small area of acute infarction in the right corona radiata.  Neurology/stroke team was consulted.  Patient was admitted for CVA work-up. CTA head and neck without large vessel occlusion.  Allow permissive hypertension.  2D Echo: LVEF 60 to 65% no RWMA,   LDL 58, well under goal.  Hemoglobin A1c 5.2.  PT and OT recommended no PT needs. Neurology recommended dual antiplatelet therapy for 3 weeks followed by Plavix only.  Patient was interested in sleep study for CPAP trial.  Patient is positive for sleep apnea, unfortunately patient could not tolerate CPAP.  Patient is cleared from neurology to be discharged.  Patient will follow-up with sleep specialist for alternate options.  Discharge Diagnoses:  Principal Problem:   CVA (cerebral vascular accident) ([place])  Acute CVA, recurrent : Patient presented with left facial droop.   LKW 11/29/21 around 10 AM. MRI brain revealed acute CVA . CTA head and neck with and without contrast to rule out LVO. 2D echo shows LVEF 60 to 65% no  RWMA Frequent neuro-checks Telemetry monitoring to rule out arrhythmia LDL well under control 58.  Hemoglobin A1c 5.2 PT recommended no PT needs. Neurology recommended dual antiplatelet therapy for 3 weeks followed by Plavix only.   Essential hypertension: Allow permissive hypertension Treat SBP greater than 220 or DBP greater than 120 Continue to hold off home Norvasc 5 mg daily Closely monitor vital signs   Obesity: BMI 32 Diet and exercise discussed in detail. At risk for OSA   Obstructive sleep apnea: Patient underwent sleep studies which is positive for OSA. Neurology recommended overnight stay for calibration for CPAP. Patient could not tolerate CPAP.  Outpatient follow-up recommended    Discharge Instructions  Discharge Instructions     Call MD for:  difficulty breathing, headache or visual disturbances   Complete by: As directed    Call MD for:  persistant dizziness or light-headedness   Complete by: As directed    Call MD for:  persistant nausea and vomiting   Complete by: As directed    Diet - low sodium heart healthy   Complete by: As directed    Diet Carb Modified   Complete by: As directed    Discharge instructions   Complete by: As directed    Advised to follow up PCP in one week. Advised to follow up Neurology as scheduled. Advised to continue dual antiplatelet therapy (aspirin+Plavix) for 3 weeks followed by Plavix only.   Increase activity slowly   Complete by: As directed       Allergies as of 12/03/2021   No Known Allergies      Medication List     STOP taking these medications  predniSONE 10 MG (21) Tbpk tablet Commonly known as: STERAPRED UNI-PAK 21 TAB       TAKE these medications    amLODipine 5 MG tablet Commonly known as: NORVASC Take 1 tablet (5 mg total) by mouth daily.   aspirin EC 81 MG tablet Take 1 tablet (81 mg total) by mouth daily.   atorvastatin 40 MG tablet Commonly known as: LIPITOR Take 1 tablet (40 mg  total) by mouth daily.   cholecalciferol 25 MCG (1000 UNIT) tablet Commonly known as: VITAMIN D3 Take 2,000 Units by mouth daily.   clopidogrel 75 MG tablet Commonly known as: PLAVIX Take 1 tablet (75 mg total) by mouth daily. Start taking on: December 04, 2021   vitamin B-12 1000 MCG tablet Commonly known as: CYANOCOBALAMIN Take 1,000 mcg by mouth daily.        Follow-up Information     Isaac Bliss, Rayford Halsted, MD Follow up in 1 week(s).   Specialty: Internal Medicine Contact information: [place] [address] [facility] Follow up.   Specialty: Sleep Medicine Why: Dr. Maureen Chatters to discuss sleep studies. Contact information: [address] [place] [phone]        Garvin Fila, MD Follow up in 2 month(s).   Specialties: Neurology, Radiology Contact information: [address] [place]  [address] [phone]                No Known Allergies  Consultations: Neurology   Procedures/Studies: ECHOCARDIOGRAM COMPLETE BUBBLE STUDY  Result Date: 12/01/2021    ECHOCARDIOGRAM REPORT   Patient Name:   Dylan Fuller Date of Exam: 12/01/2021 Medical Rec #:  [id number]       Height:       71.0 in Accession #:    [id number]      Weight:       230.0 lb Date of Birth:  15-Oct-1965       BSA:          2.238 m Patient Age:    56 years        BP:           123/74 mmHg Patient Gender: M               HR:           81 bpm. Exam Location:  Inpatient Procedure: 2D Echo, Cardiac Doppler, Color Doppler and Saline Contrast Bubble            Study Indications:     Stroke  History:         Patient has prior history of Echocardiogram examinations, most                  recent 09/28/2019. Risk Factors:Hypertension.  Sonographer:     Jefferey Pica Referring Phys:  [id number] [place] Diagnosing Phys: Glori Bickers MD IMPRESSIONS  1. Left  ventricular ejection fraction, by estimation, is 60 to 65%. The left ventricle has normal function. The left ventricle has no regional wall motion abnormalities. Left ventricular diastolic parameters are consistent with Grade I diastolic dysfunction (impaired relaxation).  2. Right ventricular systolic function is normal. The right ventricular size is normal. There is normal pulmonary artery systolic pressure.  3. Left atrial size was mildly dilated.  4. The mitral valve is normal in structure. Trivial mitral valve regurgitation. No evidence of mitral  stenosis.  5. The aortic valve is normal in structure. Aortic valve regurgitation is not visualized. No aortic stenosis is present.  6. Aortic dilatation noted. There is borderline dilatation of the ascending aorta, measuring 38 mm. There is moderate dilatation of the aortic root, measuring 45 mm.  7. The inferior vena cava is normal in size with greater than 50% respiratory variability, suggesting right atrial pressure of 3 mmHg.  8. Agitated saline contrast bubble study was negative, with no evidence of any interatrial shunt. FINDINGS  Left Ventricle: Left ventricular ejection fraction, by estimation, is 60 to 65%. The left ventricle has normal function. The left ventricle has no regional wall motion abnormalities. The left ventricular internal cavity size was normal in size. There is  no left ventricular hypertrophy. Left ventricular diastolic parameters are consistent with Grade I diastolic dysfunction (impaired relaxation). Right Ventricle: The right ventricular size is normal. No increase in right ventricular wall thickness. Right ventricular systolic function is normal. There is normal pulmonary artery systolic pressure. The tricuspid regurgitant velocity is 1.65 m/s, and  with an assumed right atrial pressure of 5 mmHg, the estimated right ventricular systolic pressure is 05.3 mmHg. Left Atrium: Left atrial size was mildly dilated. Right Atrium: Right atrial  size was normal in size. Pericardium: There is no evidence of pericardial effusion. Mitral Valve: The mitral valve is normal in structure. Trivial mitral valve regurgitation. No evidence of mitral valve stenosis. Tricuspid Valve: The tricuspid valve is normal in structure. Tricuspid valve regurgitation is trivial. No evidence of tricuspid stenosis. Aortic Valve: The aortic valve is normal in structure. Aortic valve regurgitation is not visualized. No aortic stenosis is present. Aortic valve peak gradient measures 2.8 mmHg. Pulmonic Valve: The pulmonic valve was normal in structure. Pulmonic valve regurgitation is not visualized. No evidence of pulmonic stenosis. Aorta: The aortic root is normal in size and structure and aortic dilatation noted. There is borderline dilatation of the ascending aorta, measuring 38 mm. There is moderate dilatation of the aortic root, measuring 45 mm. Venous: The inferior vena cava is normal in size with greater than 50% respiratory variability, suggesting right atrial pressure of 3 mmHg. IAS/Shunts: No atrial level shunt detected by color flow Doppler. Agitated saline contrast was given intravenously to evaluate for intracardiac shunting. Agitated saline contrast bubble study was negative, with no evidence of any interatrial shunt.  LEFT VENTRICLE PLAX 2D LVIDd:         4.40 cm   Diastology LVIDs:         2.60 cm   LV e' medial:    6.05 cm/s LV PW:         1.20 cm   LV E/e' medial:  10.0 LV IVS:        1.00 cm   LV e' lateral:   7.82 cm/s LVOT diam:     2.60 cm   LV E/e' lateral: 7.7 LV SV:         66 LV SV Index:   29 LVOT Area:     5.31 cm  RIGHT VENTRICLE            IVC RV S prime:     8.83 cm/s  IVC diam: 1.50 cm TAPSE (M-mode): 1.4 cm LEFT ATRIUM             Index LA diam:        3.90 cm 1.74 cm/m LA Vol (A2C):   57.9 ml 25.88 ml/m LA Vol (A4C):   73.9 ml  33.03 ml/m LA Biplane Vol: 67.7 ml 30.26 ml/m  AORTIC VALVE                 PULMONIC VALVE AV Area (Vmax): 4.27 cm     PV  Vmax:       0.56 m/s AV Vmax:        83.85 cm/s   PV Peak grad:  1.3 mmHg AV Peak Grad:   2.8 mmHg LVOT Vmax:      67.40 cm/s LVOT Vmean:     42.200 cm/s LVOT VTI:       0.124 m  AORTA Ao Root diam: 4.50 cm Ao Asc diam:  3.80 cm MITRAL VALVE               TRICUSPID VALVE MV Area (PHT): 3.21 cm    TR Peak grad:   10.9 mmHg MV Decel Time: 236 msec    TR Vmax:        165.00 cm/s MV E velocity: 60.30 cm/s MV A velocity: 61.70 cm/s  SHUNTS MV E/A ratio:  0.98        Systemic VTI:  0.12 m                            Systemic Diam: 2.60 cm Glori Bickers MD Electronically signed by Glori Bickers MD Signature Date/Time: 12/01/2021/4:30:05 PM    Final (Updated)    CT ANGIO HEAD NECK W WO CM  Result Date: 12/01/2021 CLINICAL DATA:  Facial droop, acute infarct on MRI EXAM: CT ANGIOGRAPHY HEAD AND NECK TECHNIQUE: Multidetector CT imaging of the head and neck was performed using the standard protocol during bolus administration of intravenous contrast. Multiplanar CT image reconstructions and MIPs were obtained to evaluate the vascular anatomy. Carotid stenosis measurements (when applicable) are obtained utilizing NASCET criteria, using the distal internal carotid diameter as the denominator. RADIATION DOSE REDUCTION: This exam was performed according to the departmental dose-optimization program which includes automated exposure control, adjustment of the mA and/or kV according to patient size and/or use of iterative reconstruction technique. CONTRAST:  80m OMNIPAQUE IOHEXOL 350 MG/ML SOLN COMPARISON:  09/28/2019 CTA head and neck FINDINGS: CT HEAD FINDINGS Brain: No evidence of acute infarction, hemorrhage, cerebral edema, mass, mass effect, or midline shift. No hydrocephalus or extra-axial fluid collection. Lacunar infarct in the left basal ganglia is new compared to the 09/28/2019 CT. Periventricular white matter changes, likely the sequela of chronic small vessel ischemic disease. The recently noted infarct in the  right corona radiata is not appreciated on this exam. Vascular: No hyperdense vessel. Skull: Normal. Negative for fracture or focal lesion. Sinuses/Orbits: No acute finding. Other: The mastoid air cells are well aerated. CTA NECK FINDINGS Aortic arch: Standard branching. Imaged portion shows no evidence of aneurysm or dissection. No significant stenosis of the major arch vessel origins. Right carotid system: No evidence of dissection, occlusion, or hemodynamically significant stenosis (greater than 50%). Left carotid system: No evidence of dissection, occlusion, or hemodynamically significant stenosis (greater than 50%). Vertebral arteries: No evidence of dissection, occlusion, or hemodynamically significant stenosis (greater than 50%). Skeleton: No acute osseous abnormality. Other neck: No acute finding. Upper chest: No focal pulmonary opacity or pleural effusion. Review of the MIP images confirms the above findings CTA HEAD FINDINGS Anterior circulation: Both internal carotid arteries are patent to the termini, with mild calcification but without significant stenosis. A1 segments patent. Normal anterior communicating artery. Anterior cerebral arteries are patent to  their distal aspects. No M1 stenosis or occlusion. Normal MCA bifurcations. Distal MCA branches perfused and symmetric. Posterior circulation: Vertebral arteries patent to the vertebrobasilar junction without stenosis. Basilar patent to its distal aspect. Superior cerebellar arteries patent proximally. Patent P1 segments. Redemonstrated mild left P1 and P2 stenosis, as well as severe stenosis at the origin of the more medial P3 branch. PCAs otherwise perfused to their distal aspects. The bilateral posterior communicating arteries are not visualized. Venous sinuses: As permitted by contrast timing, patent. Anatomic variants: None significant. Review of the MIP images confirms the above findings IMPRESSION: 1. No intracranial large vessel occlusion.  Overall unchanged atherosclerotic disease in the left P1 and P2 and right P3 segments. 2.  No hemodynamically significant stenosis in the neck. 3.  No acute intracranial process. Electronically Signed   By: Merilyn Baba M.D.   On: 12/01/2021 04:04   MR BRAIN WO CONTRAST  Result Date: 12/01/2021 CLINICAL DATA:  Facial droop EXAM: MRI HEAD WITHOUT CONTRAST TECHNIQUE: Multiplanar, multiecho pulse sequences of the brain and surrounding structures were obtained without intravenous contrast. COMPARISON:  09/28/2019 MRI FINDINGS: Brain: Small area of restricted diffusion with ADC correlate in the right corona radiata (series 5, images 81-84), which appears to be in a similar distribution to the 09/28/2019 infarct but is slightly larger. No acute hemorrhage, mass, mass effect, or midline shift. No hydrocephalus or extra-axial collection. Foci of hemosiderin deposition in the bilateral cerebral hemispheres, most prominently in the left occipital lobe, as well as in the bilateral basal ganglia and thalami. Lacunar infarcts in the bilateral basal ganglia, corona radiata, and left pons. T2 hyperintense signal in the periventricular white matter, likely the sequela of moderate chronic small vessel ischemic disease. Vascular: Normal flow voids. Skull and upper cervical spine: Normal marrow signal. Sinuses/Orbits: Mild mucosal thickening in the maxillary sinuses, left sphenoid sinus, and ethmoid air cells. The orbits are unremarkable. Other: The mastoids are well aerated. IMPRESSION: 1. Small area of acute infarction in the right corona radiata, which appears to be in a similar area to the 09/28/2019 infarct. 2. Numerous foci of hemosiderin deposition in the bilateral cerebral hemispheres most prominently in the left occipital lobe, as well as in the bilateral basal ganglia and thalami. While some of these may reflect the sequela of prior hypertensive microhemorrhages, cerebral amyloid angiopathy could appear similar,  particularly with regards to the cerebral microhemorrhages. These results were called by telephone at the time of interpretation on 12/01/2021 at 1:41 am to provider [facility] , who verbally acknowledged these results. Electronically Signed   By: Merilyn Baba M.D.   On: 12/01/2021 01:41    CT head, MRI, CTA head and neck, echo   Subjective: Patient was seen and examined at bedside.  Overnight events noted.   Patient reports feeling better,  Patient want to be discharged.   Discharge Exam: Vitals:   12/03/21 0829 12/03/21 1100  BP: 123/90 130/89  Pulse: 87 85  Resp:  17  Temp: 97.8 F (36.6 C) 97.6 F (36.4 C)  SpO2: 96% 98%   Vitals:   12/03/21 0022 12/03/21 0408 12/03/21 0829 12/03/21 1100  BP: 122/88 123/83 123/90 130/89  Pulse: 86 80 87 85  Resp: '20 17  17  '$ Temp: 98.1 F (36.7 C) 98.2 F (36.8 C) 97.8 F (36.6 C) 97.6 F (36.4 C)  TempSrc: Oral Oral Oral Oral  SpO2: 96% 93% 96% 98%  Weight:      Height:        General:  Pt is alert, awake, not in acute distress Cardiovascular: RRR, S1/S2 +, no rubs, no gallops Respiratory: CTA bilaterally, no wheezing, no rhonchi Abdominal: Soft, NT, ND, bowel sounds + Extremities: no edema, no cyanosis    The results of significant diagnostics from this hospitalization (including imaging, microbiology, ancillary and laboratory) are listed below for reference.     Microbiology: No results found for this or any previous visit (from the past 240 hour(s)).   Labs: BNP (last 3 results) No results for input(s): "BNP" in the last 8760 hours. Basic Metabolic Panel: Recent Labs  Lab 11/30/21 1755 12/01/21 0346  NA 139 135  K 3.7 3.4*  CL 104 104  CO2 25 24  GLUCOSE 109* 93  BUN 12 10  CREATININE 0.84 0.81  CALCIUM 9.9 9.0  MG  --  2.2  PHOS  --  3.0   Liver Function Tests: Recent Labs  Lab 11/30/21 1755 12/01/21 0346  AST 24 28  ALT 46* 46*  ALKPHOS 63 64  BILITOT 0.7 1.0  PROT 7.4 6.6  ALBUMIN 4.7 3.8   No  results for input(s): "LIPASE", "AMYLASE" in the last 168 hours. No results for input(s): "AMMONIA" in the last 168 hours. CBC: Recent Labs  Lab 11/30/21 1755 12/01/21 0346  WBC 7.7 8.4  NEUTROABS 4.2 4.5  HGB 15.6 15.0  HCT 44.4 42.8  MCV 84.7 85.8  PLT 251 228   Cardiac Enzymes: No results for input(s): "CKTOTAL", "CKMB", "CKMBINDEX", "TROPONINI" in the last 168 hours. BNP: Invalid input(s): "POCBNP" CBG: Recent Labs  Lab 11/30/21 1713  GLUCAP 100*   D-Dimer No results for input(s): "DDIMER" in the last 72 hours. Hgb A1c Recent Labs    12/01/21 0346  HGBA1C 5.2   Lipid Profile Recent Labs    12/01/21 0346  CHOL 116  HDL 34*  LDLCALC 58  TRIG 121  CHOLHDL 3.4   Thyroid function studies No results for input(s): "TSH", "T4TOTAL", "T3FREE", "THYROIDAB" in the last 72 hours.  Invalid input(s): "FREET3" Anemia work up No results for input(s): "VITAMINB12", "FOLATE", "FERRITIN", "TIBC", "IRON", "RETICCTPCT" in the last 72 hours. Urinalysis    Component Value Date/Time   BILIRUBINUR 1+ 10/23/2015 1115   PROTEINUR 1+ 10/23/2015 1115   UROBILINOGEN 1.0 10/23/2015 1115   NITRITE n 10/23/2015 1115   LEUKOCYTESUR Negative 10/23/2015 1115   Sepsis Labs Recent Labs  Lab 11/30/21 1755 12/01/21 0346  WBC 7.7 8.4   Microbiology No results found for this or any previous visit (from the past 240 hour(s)).   Time coordinating discharge: Over 30 minutes  SIGNED:   Shawna Clamp, MD  [facility] 12/03/2021, 4:56 PM Pager   If 7PM-7AM, please contact night-coverage

## 2021-12-03 NOTE — Progress Notes (Signed)
STROKE TEAM PROGRESS NOTE   INTERVAL HISTORY His wife is at the bedside today.  Patient still has some mild residual left facial weakness but it is improving.  He did sign consent to participate in the sleep smart study and tested positive on the NOx 3 monitor for sleep apnea but last night unfortunately he was unable to tolerate the CPAP mask due to severe claustrophobia and is a screen failure.   Vitals:   12/03/21 0022 12/03/21 0408 12/03/21 0829 12/03/21 1100  BP: 122/88 123/83 123/90 130/89  Pulse: 86 80 87 85  Resp: '20 17  17  '$ Temp: 98.1 F (36.7 C) 98.2 F (36.8 C) 97.8 F (36.6 C) 97.6 F (36.4 C)  TempSrc: Oral Oral Oral Oral  SpO2: 96% 93% 96% 98%  Weight:      Height:       CBC:  Recent Labs  Lab 11/30/21 1755 12/01/21 0346  WBC 7.7 8.4  NEUTROABS 4.2 4.5  HGB 15.6 15.0  HCT 44.4 42.8  MCV 84.7 85.8  PLT 251 333   Basic Metabolic Panel:  Recent Labs  Lab 11/30/21 1755 12/01/21 0346  NA 139 135  K 3.7 3.4*  CL 104 104  CO2 25 24  GLUCOSE 109* 93  BUN 12 10  CREATININE 0.84 0.81  CALCIUM 9.9 9.0  MG  --  2.2  PHOS  --  3.0    Lipid Panel:  Recent Labs  Lab 12/01/21 0346  CHOL 116  TRIG 121  HDL 34*  CHOLHDL 3.4  VLDL 24  LDLCALC 58    HgbA1c:  Recent Labs  Lab 12/01/21 0346  HGBA1C 5.2      IMAGING past 24 hours No results found.  PHYSICAL EXAM  Physical Exam  HEENT-  [place]/AT  Lungs- Respirations unlabored Extremities- No edema   Neurological Examination Mental Status: Alert, oriented x 5, thought content appropriate.  Speech fluent without evidence of aphasia. No dysarthria. Able to follow all commands without difficulty. Cranial Nerves: II: Temporal visual fields intact with no extinction to DSS. PERRL  III,IV, VI: No ptosis. EOMI. No nystagmus.  V: Temp sensation equal bilaterally  VII: mild Left facial droop. Left palpebral fissure wider than on the right, which patient states is chronic. Eyelid closure strength is full and  equal bilaterally.  patient able to whistle VIII: Hearing intact to voice IX,X: No hoarseness XI: Symmetric shoulder shrug XII: Midline tongue extension Motor: RUE 5/5 proximally and distally LUE 5/5 proximally and distally except for 4/5 left deltoid BLE 5/5 proximally and distally without asymmetry No pronator drift.  Sensory: Temp and light touch intact throughout, bilaterally. No extinction to DSS.  Deep Tendon Reflexes: 2+ and symmetric throughout Cerebellar: No ataxia with FNF or H-S bilaterally  Gait: Deferred    ASSESSMENT/PLAN [name] is a 56 y.o. male with history of left pontine stroke and right corona radiata/posterior lentiform stroke (4/21), HLD, HTN, obesity and chronic prostatitis who presented to the [facility] ED on Sunday evening with complaints of left sided facial droop that had been first noticed on Saturday morning at 1000 upon waking. LKN was the night prior. He denied any speech difficulty or limb weakness at that time. He was sent to the [facility] ED where MRI brain revealed an acute right corona radiata ischemic infarction as well as multiple chronic foci of hemosiderin deposition concerning for  hypertensive hemorrhages.   Admitting BP was 161/96.    On bedside interview, the patient denies  any neurological symptoms other than his persisting left facial droop, including no dysphasia, dysarthria, limb weakness, limb numbness, incoordination, face numbness, ataxia or difficulty walking.   MRI brain demonstrates a small area of acute infarction in the right corona radiata, which appears to be in a similar area to the 4/21 infarct.   Stroke:  right corona radiata infarct   likely secondary small vessel disease CTA head & neck:   1. No intracranial large vessel occlusion. Overall unchanged atherosclerotic disease in the left P1 and P2 and right P3 segments. 2.  No hemodynamically significant stenosis in the neck. 3.  No acute intracranial  process.     MRI Brain:  1. Small area of acute infarction in the right corona radiata, which appears to be in a similar area to the 09/28/2019 infarct. 2. Numerous foci of hemosiderin deposition in the bilateral cerebral hemispheres most prominently in the left occipital lobe, as well as in the bilateral basal ganglia and thalami. While some of these may reflect the sequela of prior hypertensive microhemorrhages, cerebral amyloid angiopathy could appear similar, particularly with regards to the cerebral microhemorrhages.   2D Echo EF 60 to 65%. LDL 58 HgbA1c 5.2 VTE prophylaxis - scd    Diet   Diet heart healthy/carb modified Room service appropriate? Yes; Fluid consistency: Thin   aspirin 81 mg daily prior to admission, now on aspirin 81 mg daily and clopidogrel 75 mg daily for 3 weeks then followed by plavix '75mg'$  alone daily Therapy recommendations:    SLP: no follow up planned  PT: no follow up planned OT: no follow up planned Disposition:  home   Hypertension Home meds:  amlodipine '5mg'$  daily  Stable Permissive hypertension (OK if < 220/120) but gradually normalize in 5-7 days Long-term BP goal normotensive  Hyperlipidemia Home meds:   none, lipitor '40mg'$  started in hospital LDL 58, goal < 7-   Continue statin at discharge  HgbA1c 5.2, goal < 7.0 CBGs Recent Labs    11/30/21 1713  GLUCAP 100*    SSI  Other Stroke Risk Factors   Obesity, Body mass index is 32.08 kg/m., BMI >/= 30 associated with increased stroke risk, recommend weight loss, diet and exercise as appropriate   Hx stroke/TIA see above re: 09/2019  Other Active Problems Snoring: consider testing for sleep apnea   Hospital day # 1  Patient presented with transient left facial droop due to right corona radiata infarct due to small vessel disease.  Recommend aspirin and Plavix for 3 weeks followed by Plavix alone and aggressive risk factor modification.  Patient also appears to be at risk for  obstructive sleep apnea and is  participating in the sleep smart stroke prevention study.  But however is a screen failure as he was unable to tolerate the CPAP mask last night.  Plan to refer to Dr. Ardeth Sportsman for outpatient consultation to discuss alternative treatment options for sleep apnea greater than 50% time during this 35 minute visit was spent in counseling and coordination of care about his lacunar stroke and discussion about sleep apnea and stroke prevention and answering questions.  Discussed with Dr. Dwyane Dee, patient and his wife and answered questions.  Follow-up as outpatient with stroke clinic in 2 months Dylan Fuller, [place] Pager: [phone] 12/03/2021 1:07 PM   To contact Stroke Continuity provider, please refer to [email or web address] After hours, contact General Neurology

## 2021-12-03 NOTE — Discharge Instructions (Signed)
Advised to follow up PCP in one week. Advised to follow up Neurology as scheduled. Advised to continue dual antiplatelet therapy (aspirin+Plavix) for 3 weeks followed by Plavix only.

## 2021-12-04 ENCOUNTER — Telehealth: Payer: Self-pay

## 2021-12-04 NOTE — Telephone Encounter (Signed)
Transition Care Management Follow-up Telephone Call Date of discharge and from where: [facility] 12-03-21 Dx: CVA How have you been since you were released from the hospital? Doing well Any questions or concerns? No  Items Reviewed: Did the pt receive and understand the discharge instructions provided? Yes  Medications obtained and verified? Yes  Other? No  Any new allergies since your discharge? No  Dietary orders reviewed? Yes Do you have support at home? Yes   Home Care and Equipment/Supplies: Were home health services ordered? no If so, what is the name of the agency? na  Has the agency set up a time to come to the patient's home? not applicable Were any new equipment or medical supplies ordered?  No What is the name of the medical supply agency? na Were you able to get the supplies/equipment? not applicable Do you have any questions related to the use of the equipment or supplies? No  Functional Questionnaire: (I = Independent and D = Dependent) ADLs: I  Bathing/Dressing- I  Meal Prep- I  Eating- I  Maintaining continence- I  Transferring/Ambulation- I  Managing Meds- I  Follow up appointments reviewed:  PCP Hospital f/u appt confirmed? Yes  Scheduled to see Dr Isaac Bliss on 12-17-21 @ 230pm. [facility] f/u appt confirmed? No .- pt will call Neurology to make fu appt  Are transportation arrangements needed? No  If their condition worsens, is the pt aware to call PCP or go to the Emergency Dept.? Yes Was the patient provided with contact information for the PCP's office or ED? Yes Was to pt encouraged to call back with questions or concerns? Yes

## 2021-12-17 ENCOUNTER — Encounter: Payer: Self-pay | Admitting: Internal Medicine

## 2021-12-17 ENCOUNTER — Ambulatory Visit (INDEPENDENT_AMBULATORY_CARE_PROVIDER_SITE_OTHER): Payer: Managed Care, Other (non HMO) | Admitting: Internal Medicine

## 2021-12-17 VITALS — BP 120/84 | HR 103 | Temp 97.8°F | Ht 71.0 in | Wt 243.0 lb

## 2021-12-17 DIAGNOSIS — E785 Hyperlipidemia, unspecified: Secondary | ICD-10-CM

## 2021-12-17 DIAGNOSIS — I10 Essential (primary) hypertension: Secondary | ICD-10-CM | POA: Diagnosis not present

## 2021-12-17 DIAGNOSIS — Z09 Encounter for follow-up examination after completed treatment for conditions other than malignant neoplasm: Secondary | ICD-10-CM | POA: Diagnosis not present

## 2021-12-17 DIAGNOSIS — Z8673 Personal history of transient ischemic attack (TIA), and cerebral infarction without residual deficits: Secondary | ICD-10-CM | POA: Diagnosis not present

## 2021-12-17 DIAGNOSIS — H6123 Impacted cerumen, bilateral: Secondary | ICD-10-CM

## 2021-12-17 NOTE — Progress Notes (Signed)
Established Patient Office Visit     CC/Reason for Visit: Hospital follow-up, hearing difficulty  HPI: Dylan Fuller is a 56 y.o. male who is coming in today for the above mentioned reasons. Past Medical History is significant for: Hypertension, hyperlipidemia, prior CVA.  On June 17 he noted a left-sided facial droop and proceeded to the emergency department where he was found to have an acute CVA.  This is a second CVA.  He was asked to do dual antiplatelet therapy for 3 weeks and then do Plavix alone.  He is also complaining of some difficulty hearing bilaterally and believes that he has a cerumen impaction.   Past Medical/Surgical History: Past Medical History:  Diagnosis Date   Constipation    CVA (cerebral vascular accident) ([place])    09/2019   Hyperlipidemia    Hypertension    Obesity (BMI 30.0-34.9)    Prostatitis, chronic     Past Surgical History:  Procedure Laterality Date   HERNIA REPAIR     age 53 - ?ingunial x2    Social History:  reports that he has never smoked. He has never used smokeless tobacco. He reports current alcohol use. He reports that he does not use drugs.  Allergies: No Known Allergies  Family History:  Family History  Problem Relation Age of Onset   Arthritis Mother    Heart disease Mother    Hyperlipidemia Mother    Hypertension Mother    Dementia Mother    Heart disease Father        9; post-rheumatic fever valve disease   Colon cancer Neg Hx    Colon polyps Neg Hx    Esophageal cancer Neg Hx    Rectal cancer Neg Hx    Stomach cancer Neg Hx    Stroke Neg Hx      Current Outpatient Medications:    amLODipine (NORVASC) 5 MG tablet, Take 1 tablet (5 mg total) by mouth daily., Disp: 90 tablet, Rfl: 0   aspirin EC 81 MG tablet, Take 1 tablet (81 mg total) by mouth daily., Disp: 30 tablet, Rfl: 0   atorvastatin (LIPITOR) 40 MG tablet, Take 1 tablet (40 mg total) by mouth daily., Disp: 90 tablet, Rfl: 0   cholecalciferol  (VITAMIN D3) 25 MCG (1000 UNIT) tablet, Take 2,000 Units by mouth daily., Disp: , Rfl:    clopidogrel (PLAVIX) 75 MG tablet, Take 1 tablet (75 mg total) by mouth daily., Disp: 30 tablet, Rfl: 3   vitamin B-12 (CYANOCOBALAMIN) 1000 MCG tablet, Take 1,000 mcg by mouth daily., Disp: , Rfl:   Review of Systems:  Constitutional: Denies fever, chills, diaphoresis, appetite change and fatigue.  HEENT: Denies photophobia, eye pain, redness,  congestion, sore throat, rhinorrhea, sneezing, mouth sores, trouble swallowing, neck pain, neck stiffness and tinnitus.   Respiratory: Denies SOB, DOE, cough, chest tightness,  and wheezing.   Cardiovascular: Denies chest pain, palpitations and leg swelling.  Gastrointestinal: Denies nausea, vomiting, abdominal pain, diarrhea, constipation, blood in stool and abdominal distention.  Genitourinary: Denies dysuria, urgency, frequency, hematuria, flank pain and difficulty urinating.  Endocrine: Denies: hot or cold intolerance, sweats, changes in hair or nails, polyuria, polydipsia. Musculoskeletal: Denies myalgias, back pain, joint swelling, arthralgias and gait problem.  Skin: Denies pallor, rash and wound.  Neurological: Denies dizziness, seizures, syncope, weakness, light-headedness, numbness and headaches.  Hematological: Denies adenopathy. Easy bruising, personal or family bleeding history  Psychiatric/Behavioral: Denies suicidal ideation, mood changes, confusion, nervousness, sleep disturbance and agitation  Physical Exam: Vitals:   12/17/21 1420 12/17/21 1424 12/17/21 1456  BP: (!) 140/100 140/90 120/84  Pulse: (!) 103    Temp: 97.8 F (36.6 C)    TempSrc: Oral    SpO2: 97%    Weight: 243 lb (110.2 kg)    Height: '5\' 11"'$  (1.803 m)      Body mass index is 33.89 kg/m.   Constitutional: NAD, calm, comfortable Eyes: PERRL, lids and conjunctivae normal ENMT: Mucous membranes are moist. Posterior pharynx clear of any exudate or lesions. Normal  dentition. Tympanic membrane is obstructed by cerumen bilaterally Respiratory: clear to auscultation bilaterally, no wheezing, no crackles. Normal respiratory effort. No accessory muscle use.  Cardiovascular: Regular rate and rhythm, no murmurs / rubs / gallops. No extremity edema.  Psychiatric: Normal judgment and insight. Alert and oriented x 3. Normal mood.    Impression and Plan:  Hospital discharge follow-up  H/O: CVA (cerebrovascular accident) -He has had 2 strokes, he is currently on dual antiplatelet therapy with plans to discontinue aspirin on July 13. -He has never had an event monitor, I will place referral to cardiology.  Hyperlipidemia, unspecified hyperlipidemia type -LDL is at goal of 58.  Currently on atorvastatin 40 mg.  Primary hypertension -Fair control today.  Bilateral impacted cerumen Cerumen Desimpaction  After patient consent was obtained, warm water was applied and gentle ear lavage performed on bilateral ears. There were no complications and following the desimpaction the tympanic membranes were visible. Tympanic membranes are intact following the procedure. Auditory canals are normal. The patient reported relief of symptoms after removal of cerumen.     Time spent:31 minutes reviewing chart, interviewing and examining patient and formulating plan of care.    Lelon Frohlich, MD [facility]

## 2022-01-05 ENCOUNTER — Encounter: Payer: Self-pay | Admitting: Gastroenterology

## 2022-01-08 ENCOUNTER — Other Ambulatory Visit: Payer: Self-pay | Admitting: Internal Medicine

## 2022-01-08 DIAGNOSIS — E785 Hyperlipidemia, unspecified: Secondary | ICD-10-CM

## 2022-01-08 DIAGNOSIS — I10 Essential (primary) hypertension: Secondary | ICD-10-CM

## 2022-01-21 ENCOUNTER — Ambulatory Visit (INDEPENDENT_AMBULATORY_CARE_PROVIDER_SITE_OTHER): Payer: Managed Care, Other (non HMO) | Admitting: Neurology

## 2022-01-21 ENCOUNTER — Encounter: Payer: Self-pay | Admitting: Neurology

## 2022-01-21 VITALS — BP 125/89 | HR 89 | Ht 72.0 in | Wt 245.4 lb

## 2022-01-21 DIAGNOSIS — I6381 Other cerebral infarction due to occlusion or stenosis of small artery: Secondary | ICD-10-CM

## 2022-01-21 DIAGNOSIS — G4733 Obstructive sleep apnea (adult) (pediatric): Secondary | ICD-10-CM | POA: Diagnosis not present

## 2022-01-21 DIAGNOSIS — R29810 Facial weakness: Secondary | ICD-10-CM | POA: Diagnosis not present

## 2022-01-21 DIAGNOSIS — I639 Cerebral infarction, unspecified: Secondary | ICD-10-CM

## 2022-01-21 NOTE — Patient Instructions (Signed)
I had a long d/w patient about his recent lacunar stroke, sleep apnoea,risk for recurrent stroke/TIAs, personally independently reviewed imaging studies and stroke evaluation results and answered questions.Continue Plavix 75 mg daily  for secondary stroke prevention and maintain strict control of hypertension with blood pressure goal below 130/90, diabetes with hemoglobin A1c goal below 6.5% and lipids with LDL cholesterol goal below 70 mg/dL. I also advised the patient to eat a healthy diet with plenty of whole grains, cereals, fruits and vegetables, exercise regularly and maintain ideal body weight .patient tested positive for obstructive sleep apnea on the NOx 3 monitor which was a screening failure in the sleep mode study as he was unable to tolerate CPAP mask.  Plan to refer to sleep clinic to discuss alternative treatment options for his sleep apnea.  Followup in the future with me in 6 months or call earlier if needed.  Stroke Prevention Some medical conditions and behaviors can lead to a higher chance of having a stroke. You can help prevent a stroke by eating healthy, exercising, not smoking, and managing any medical conditions you have. Stroke is a leading cause of functional impairment. Primary prevention is particularly important because a majority of strokes are first-time events. Stroke changes the lives of not only those who experience a stroke but also their family and other caregivers. How can this condition affect me? A stroke is a medical emergency and should be treated right away. A stroke can lead to brain damage and can sometimes be life-threatening. If a person gets medical treatment right away, there is a better chance of surviving and recovering from a stroke. What can increase my risk? The following medical conditions may increase your risk of a stroke: Cardiovascular disease. High blood pressure (hypertension). Diabetes. High cholesterol. Sickle cell disease. Blood clotting  disorders (hypercoagulable state). Obesity. Sleep disorders (obstructive sleep apnea). Other risk factors include: Being older than age 47. Having a history of blood clots, stroke, or mini-stroke (transient ischemic attack, TIA). Genetic factors, such as race, ethnicity, or a family history of stroke. Smoking cigarettes or using other tobacco products. Taking birth control pills, especially if you also use tobacco. Heavy use of alcohol or drugs, especially cocaine and methamphetamine. Physical inactivity. What actions can I take to prevent this? Manage your health conditions High cholesterol levels. Eating a healthy diet is important for preventing high cholesterol. If cholesterol cannot be managed through diet alone, you may need to take medicines. Take any prescribed medicines to control your cholesterol as told by your health care provider. Hypertension. To reduce your risk of stroke, try to keep your blood pressure below 130/80. Eating a healthy diet and exercising regularly are important for controlling blood pressure. If these steps are not enough to manage your blood pressure, you may need to take medicines. Take any prescribed medicines to control hypertension as told by your health care provider. Ask your health care provider if you should monitor your blood pressure at home. Have your blood pressure checked every year, even if your blood pressure is normal. Blood pressure increases with age and some medical conditions. Diabetes. Eating a healthy diet and exercising regularly are important parts of managing your blood sugar (glucose). If your blood sugar cannot be managed through diet and exercise, you may need to take medicines. Take any prescribed medicines to control your diabetes as told by your health care provider. Get evaluated for obstructive sleep apnea. Talk to your health care provider about getting a sleep evaluation if you  snore a lot or have excessive sleepiness. Make  sure that any other medical conditions you have, such as atrial fibrillation or atherosclerosis, are managed. Nutrition Follow instructions from your health care provider about what to eat or drink to help manage your health condition. These instructions may include: Reducing your daily calorie intake. Limiting how much salt (sodium) you use to 1,500 milligrams (mg) each day. Using only healthy fats for cooking, such as olive oil, canola oil, or sunflower oil. Eating healthy foods. You can do this by: Choosing foods that are high in fiber, such as whole grains, and fresh fruits and vegetables. Eating at least 5 servings of fruits and vegetables a day. Try to fill one-half of your plate with fruits and vegetables at each meal. Choosing lean protein foods, such as lean cuts of meat, poultry without skin, fish, tofu, beans, and nuts. Eating low-fat dairy products. Avoiding foods that are high in sodium. This can help lower blood pressure. Avoiding foods that have saturated fat, trans fat, and cholesterol. This can help prevent high cholesterol. Avoiding processed and prepared foods. Counting your daily carbohydrate intake.  Lifestyle If you drink alcohol: Limit how much you have to: 0-1 drink a day for women who are not pregnant. 0-2 drinks a day for men. Know how much alcohol is in your drink. In the [place] one drink equals one 12 oz bottle of beer (376m), one 5 oz glass of wine (1448m, or one 1 oz glass of hard liquor (4487m Do not use any products that contain nicotine or tobacco. These products include cigarettes, chewing tobacco, and vaping devices, such as e-cigarettes. If you need help quitting, ask your health care provider. Avoid secondhand smoke. Do not use drugs. Activity  Try to stay at a healthy weight. Get at least 30 minutes of exercise on most days, such as: Fast walking. Biking. Swimming. Medicines Take over-the-counter and prescription medicines only as told by your  health care provider. Aspirin or blood thinners (antiplatelets or anticoagulants) may be recommended to reduce your risk of forming blood clots that can lead to stroke. Avoid taking birth control pills. Talk to your health care provider about the risks of taking birth control pills if: You are over 35 73ars old. You smoke. You get very bad headaches. You have had a blood clot. Where to find more information American Stroke Association: www.strokeassociation.org Get help right away if: You or a loved one has any symptoms of a stroke. "BE FAST" is an easy way to remember the main warning signs of a stroke: B - Balance. Signs are dizziness, sudden trouble walking, or loss of balance. E - Eyes. Signs are trouble seeing or a sudden change in vision. F - Face. Signs are sudden weakness or numbness of the face, or the face or eyelid drooping on one side. A - Arms. Signs are weakness or numbness in an arm. This happens suddenly and usually on one side of the body. S - Speech. Signs are sudden trouble speaking, slurred speech, or trouble understanding what people say. T - Time. Time to call emergency services. Write down what time symptoms started. You or a loved one has other signs of a stroke, such as: A sudden, severe headache with no known cause. Nausea or vomiting. Seizure. These symptoms may represent a serious problem that is an emergency. Do not wait to see if the symptoms will go away. Get medical help right away. Call your local emergency services (911 in the [place] Do not  drive yourself to the hospital. Summary You can help to prevent a stroke by eating healthy, exercising, not smoking, limiting alcohol intake, and managing any medical conditions you may have. Do not use any products that contain nicotine or tobacco. These include cigarettes, chewing tobacco, and vaping devices, such as e-cigarettes. If you need help quitting, ask your health care provider. Remember "BE FAST" for warning  signs of a stroke. Get help right away if you or a loved one has any of these signs. This information is not intended to replace advice given to you by your health care provider. Make sure you discuss any questions you have with your health care provider. Document Revised: 01/01/2020 Document Reviewed: 01/01/2020 Elsevier Patient Education  [place].

## 2022-01-21 NOTE — Progress Notes (Signed)
[facility] [address] [place]. [place] [address] [phone]       OFFICE FOLLOW-UP NOTE  Dylan Fuller Date of Birth:  [birth year] Medical Record Number:  [id number]   HPI: Dylan Fuller is a 55 year old Caucasian male seen today for initial office follow-up visit following hospital consultation for stroke in June 2023.  History is obtained from the patient and review of electronic medical records and I personally reviewed pertinent available imaging films in PACS.  He is a 56 y.o. male with history of left pontine stroke and right corona radiata/posterior lentiform stroke (09/2019), HLD, HTN, obesity and chronic prostatitis who presented to the [facility] ED on 12/02/21 evening with complaints of left sided facial droop that had been first noticed on Saturday morning at 1000 upon waking. LKN was the night prior. He denied any speech difficulty or limb weakness at that time. He was sent to the [facility] ED where MRI brain revealed an acute right corona radiata ischemic infarction as well as multiple chronic foci of hemosiderin deposition concerning for  hypertensive hemorrhages.  His only complaint was left facial droop and weakness but no dysarthria, extremity weakness numbness gait or balance problems.  Interestingly his stroke appeared to be is in a similar area to his previous stroke from April 2021.  CT angiogram of the head and neck showed no large vessel intracranial stenosis.  There was unchanged atherosclerotic changes in the left P1 and P2 and right P3 segments.  Echocardiogram showed ejection fraction of 60 to 65% without cardiac source of embolism.  LDL cholesterol 58 mg percent hemoglobin A1c was 5.2.  Patient appeared to be at risk for sleep apnea and agreed to participate in the sleep smart stroke prevention study and tested positive for sleep apnea on the overnight NOx 3 monitor however he ended up being a screen failure as he had trouble tolerating the CPAP  mask.  He was on aspirin prior to admission and Plavix was added for 3 weeks to be followed by Plavix alone.  Patient is is done well with left facial droop and weakness seems to have improved.  Only occasionally when he is tired he may notice slight droop on the left.  He thinks he is made full recovery.  He is tolerating Plavix well without bruising or bleeding.  He is also on Lipitor which is tolerating well without muscle aches and pain.  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  He started eating healthy and has started exercising regularly as well and plans to lose weight.  His blood pressure is well-controlled today it is 125/89.  He has no complaints today.  He is agreeable to be referred to the sleep clinic to discuss alternative treatment for his sleep apnea  ROS:   14 system review of systems is positive for facial droop, speech difficulty, sleep apnea all other systems negative  PMH:  Past Medical History:  Diagnosis Date   Constipation    CVA (cerebral vascular accident) ([place])    09/2019   Hyperlipidemia    Hypertension    Obesity (BMI 30.0-34.9)    Prostatitis, chronic     Social History:  Social History   Socioeconomic History   Marital status: Married    Spouse name: Not on file   Number of children: Not on file   Years of education: Not on file   Highest education level: Professional school degree (e.g., MD, DDS, DVM, JD)  Occupational History  Occupation: pharmacist  Tobacco Use   Smoking status: Never   Smokeless tobacco: Never   Tobacco comments:    pt states he has smoked a few cigarettes in his lifetime only   Substance and Sexual Activity   Alcohol use: Yes    Comment: social   Drug use: No   Sexual activity: Not on file  Other Topics Concern   Not on file  Social History Narrative   Not on file   Social Determinants of Health   Financial Resource Strain: Low Risk  (08/21/2021)   Overall Financial Resource Strain (CARDIA)    Difficulty of  Paying Living Expenses: Not hard at all  Food Insecurity: No Food Insecurity (08/21/2021)   Hunger Vital Sign    Worried About Running Out of Food in the Last Year: Never true    [place] in the Last Year: Never true  Transportation Needs: No Transportation Needs (08/21/2021)   PRAPARE - Hydrologist (Medical): No    Lack of Transportation (Non-Medical): No  Physical Activity: Insufficiently Active (08/21/2021)   Exercise Vital Sign    Days of Exercise per Week: 1 day    Minutes of Exercise per Session: 20 min  Stress: No Stress Concern Present (08/21/2021)   [place]    Feeling of Stress : Not at all  Social Connections: Moderately Isolated (08/21/2021)   Social Connection and Isolation Panel [NHANES]    Frequency of Communication with Friends and Family: Once a week    Frequency of Social Gatherings with Friends and Family: Once a week    Attends Religious Services: 1 to 4 times per year    Active Member of [employer] or Organizations: No    Attends Music therapist: Not on file    Marital Status: Married  Human resources officer Violence: Not on file    Medications:   Current Outpatient Medications on File Prior to Visit  Medication Sig Dispense Refill   amLODipine (NORVASC) 5 MG tablet TAKE ONE TABLET BY MOUTH DAILY 90 tablet 0   atorvastatin (LIPITOR) 40 MG tablet TAKE ONE TABLET BY MOUTH DAILY 90 tablet 0   cholecalciferol (VITAMIN D3) 25 MCG (1000 UNIT) tablet Take 2,000 Units by mouth daily.     clopidogrel (PLAVIX) 75 MG tablet Take 1 tablet (75 mg total) by mouth daily. 30 tablet 3   vitamin B-12 (CYANOCOBALAMIN) 1000 MCG tablet Take 1,000 mcg by mouth daily.     No current facility-administered medications on file prior to visit.    Allergies:  No Known Allergies  Physical Exam General: Obese middle-aged Caucasian male, seated, in no evident distress Head: head  normocephalic and atraumatic.  Neck: supple with no carotid or supraclavicular bruits Cardiovascular: regular rate and rhythm, no murmurs Musculoskeletal: no deformity Skin:  no rash/petichiae Vascular:  Normal pulses all extremities Vitals:   01/21/22 0751  BP: 125/89  Pulse: 89   Neurologic Exam Mental Status: Awake and fully alert. Oriented to place and time. Recent and remote memory intact. Attention span, concentration and fund of knowledge appropriate. Mood and affect appropriate.  Cranial Nerves: Fundoscopic exam reveals sharp disc margins. Pupils equal, briskly reactive to light. Extraocular movements full without nystagmus. Visual fields full to confrontation. Hearing intact. Facial sensation intact. Face, tongue, palate moves normally and symmetrically.  Motor: Normal bulk and tone. Normal strength in all tested extremity muscles.  Diminished fine finger movements on the  left hand.  Orbits right over left upper extremity. Sensory.: intact to touch ,pinprick .position and vibratory sensation.  Coordination: Rapid alternating movements normal in all extremities. Finger-to-nose and heel-to-shin performed accurately bilaterally. Gait and Station: Arises from chair without difficulty. Stance is normal. Gait demonstrates normal stride length and balance . Able to heel, toe and tandem walk without difficulty.  Reflexes: 1+ and symmetric. Toes downgoing.   NIHSS  0 Modified Rankin  1   ASSESSMENT: 56 year old Caucasian male with right corona radiata subcortical infarct due to small vessel disease in June 2023 with vascular risk factors of hypertension, hyperlipidemia, obesity and sleep apnea     PLAN: I had a long d/w patient about his recent lacunar stroke, sleep apnoea,risk for recurrent stroke/TIAs, personally independently reviewed imaging studies and stroke evaluation results and answered questions.Continue Plavix 75 mg daily  for secondary stroke prevention and maintain strict  control of hypertension with blood pressure goal below 130/90, diabetes with hemoglobin A1c goal below 6.5% and lipids with LDL cholesterol goal below 70 mg/dL. I also advised the patient to eat a healthy diet with plenty of whole grains, cereals, fruits and vegetables, exercise regularly and maintain ideal body weight .patient tested positive for obstructive sleep apnea on the NOx 3 monitor which was a screening failure in the sleep smart stroke prevention study as he was unable to tolerate CPAP mask.  Plan to refer to sleep clinic to discuss alternative treatment options for his sleep apnea.  Followup in the future with me in 6 months or call earlier if needed.Greater than 50% of time during this 35 minute visit was spent on counseling,explanation of diagnosis, of lacunar stroke and sleep apnoea, planning of further management, discussion with patient and family and coordination of care Antony Contras, MD Note: This document was prepared with digital dictation and possible smart phrase technology. Any transcriptional errors that result from this process are unintentional

## 2022-01-30 ENCOUNTER — Ambulatory Visit (INDEPENDENT_AMBULATORY_CARE_PROVIDER_SITE_OTHER): Payer: Managed Care, Other (non HMO) | Admitting: Cardiology

## 2022-01-30 ENCOUNTER — Ambulatory Visit: Payer: Managed Care, Other (non HMO)

## 2022-01-30 ENCOUNTER — Encounter: Payer: Self-pay | Admitting: Cardiology

## 2022-01-30 VITALS — BP 120/80 | HR 91 | Ht 72.0 in | Wt 241.0 lb

## 2022-01-30 DIAGNOSIS — I639 Cerebral infarction, unspecified: Secondary | ICD-10-CM

## 2022-01-30 DIAGNOSIS — E785 Hyperlipidemia, unspecified: Secondary | ICD-10-CM | POA: Diagnosis not present

## 2022-01-30 DIAGNOSIS — I77810 Thoracic aortic ectasia: Secondary | ICD-10-CM

## 2022-01-30 DIAGNOSIS — I10 Essential (primary) hypertension: Secondary | ICD-10-CM

## 2022-01-30 DIAGNOSIS — R931 Abnormal findings on diagnostic imaging of heart and coronary circulation: Secondary | ICD-10-CM | POA: Diagnosis not present

## 2022-01-30 NOTE — Progress Notes (Unsigned)
ZIO XT mailed to pt's home address. 

## 2022-01-30 NOTE — Patient Instructions (Signed)
Medication Instructions:  The current medical regimen is effective;  continue present plan and medications.  *If you need a refill on your cardiac medications before your next appointment, please call your pharmacy*  Testing/Procedures: Cardiac CT Angiography (CTA), is a special type of CT scan that uses a computer to produce multi-dimensional views of major blood vessels throughout the body. In CT angiography, a contrast material is injected through an IV to help visualize the blood vessels.  This will be completed at [facility] and you will be contacted to be scheduled.  ZIO XT- Long Term Monitor Instructions  Your physician has requested you wear a ZIO patch monitor for 14 days.  This is a single patch monitor. [employer] supplies one patch monitor per enrollment. Additional stickers are not available. Please do not apply patch if you will be having a Nuclear Stress Test,  Echocardiogram, Cardiac CT, MRI, or Chest Xray during the period you would be wearing the  monitor. The patch cannot be worn during these tests. You cannot remove and re-apply the  ZIO XT patch monitor.  Your ZIO patch monitor will be mailed 3 day USPS to your address on file. It may take 3-5 days  to receive your monitor after you have been enrolled.  Once you have received your monitor, please review the enclosed instructions. Your monitor  has already been registered assigning a specific monitor serial # to you.  Billing and Patient Assistance Program Information  We have supplied [employer] with any of your insurance information on file for billing purposes. [employer] offers a sliding scale Patient Assistance Program for patients that do not have  insurance, or whose insurance does not completely cover the cost of the ZIO monitor.  You must apply for the Patient Assistance Program to qualify for this discounted rate.  To apply, please call [employer] at [phone], select option 4, select option 2, ask to apply for   Patient Assistance Program. Theodore Demark will ask your household income, and how many people  are in your household. They will quote your out-of-pocket cost based on that information.  [employer] will also be able to set up a 85-month interest-free payment plan if needed.  Applying the monitor   Shave hair from upper left chest.  Hold abrader disc by orange tab. Rub abrader in 40 strokes over the upper left chest as  indicated in your monitor instructions.  Clean area with 4 enclosed alcohol pads. Let dry.  Apply patch as indicated in monitor instructions. Patch will be placed under collarbone on left  side of chest with arrow pointing upward.  Rub patch adhesive wings for 2 minutes. Remove white label marked "1". Remove the white  label marked "2". Rub patch adhesive wings for 2 additional minutes.  While looking in a mirror, press and release button in center of patch. A small green light will  flash 3-4 times. This will be your only indicator that the monitor has been turned on.  Do not shower for the first 24 hours. You may shower after the first 24 hours.  Press the button if you feel a symptom. You will hear a small click. Record Date, Time and  Symptom in the Patient Logbook.  When you are ready to remove the patch, follow instructions on the last 2 pages of Patient  Logbook. Stick patch monitor onto the last page of Patient Logbook.  Place Patient Logbook in the blue and white box. Use locking tab on box and tape box closed  securely. The blue and white box has prepaid postage on it. Please place it in the mailbox as  soon as possible. Your physician should have your test results approximately 7 days after the  monitor has been mailed back to [facility].  Call [place] at [phone] if you have questions regarding  your ZIO XT patch monitor. Call them immediately if you see an orange light blinking on your  monitor.  If your monitor falls off in less than 4  days, contact our Monitor department at [phone].  If your monitor becomes loose or falls off after 4 days call [employer] at [phone] for  suggestions on securing your monitor  Follow-Up: At [facility], you and your health needs are our priority.  As part of our continuing mission to provide you with exceptional heart care, we have created designated Provider Care Teams.  These Care Teams include your primary Cardiologist (physician) and Advanced Practice Providers (APPs -  Physician Assistants and Nurse Practitioners) who all work together to provide you with the care you need, when you need it.  We recommend signing up for the patient portal called "MyChart".  Sign up information is provided on this After Visit Summary.  MyChart is used to connect with patients for Virtual Visits (Telemedicine).  Patients are able to view lab/test results, encounter notes, upcoming appointments, etc.  Non-urgent messages can be sent to your provider as well.   To learn more about what you can do with MyChart, go to [email or web address].    Your next appointment:   Follow up will be based on the results of the above testing.   Important Information About Sugar

## 2022-01-30 NOTE — Progress Notes (Signed)
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 Isaac Bliss. Also has hypertension, hyperlipidemia, obesity, and hearing difficulty.  He saw his PCP 12/17/2021 where he confirmed having 2 strokes: initially in 09/2019 and recently on 11/30/21. He was on dual antiplatelet therapy with plans to discontinue aspirin on July 13. It was noted that he had never had an event monitor, so he was referred to cardiology for further evaluation and management.  He was admitted to the hospital 11/30/2021 with complaints of persistent left-sided facial droop with onset one day prior. Non contrast MRI revealed acute CVA involving small area of acute infarction in the right corona radiata. A 2D echo showed LVEF 60-65% and no RWMA. Neurology recommended dual antiplatelet therapy for 3 weeks followed by Plavix only.  Today: He is accompanied by his wife.  He reports that he has had 3 strokes. The first stroke was found incidentally during workup of his stroke in 09/2019. He had not been aware of the first stroke as there were no symptoms.  At this time he denies any recurrent episodes of atrial fibrillation.  In his family, his father had rheumatic fever as a child, eventually had a valve replacement.  He is not a smoker.  He denies any palpitations, chest pain, shortness of breath, or peripheral edema. No lightheadedness, headaches, syncope, orthopnea, or PND.   Past Medical History:  Diagnosis Date   Constipation    CVA (cerebral vascular accident) ([place])    09/2019   Hyperlipidemia    Hypertension    Obesity (BMI 30.0-34.9)    Prostatitis, chronic     Past Surgical  History:  Procedure Laterality Date   HERNIA REPAIR     age 56 - ?ingunial x2    Current Medications: Current Meds  Medication Sig   amLODipine (NORVASC) 5 MG tablet TAKE ONE TABLET BY MOUTH DAILY   atorvastatin (LIPITOR) 40 MG tablet TAKE ONE TABLET BY MOUTH DAILY   cholecalciferol (VITAMIN D3) 25 MCG (1000 UNIT) tablet Take 2,000 Units by mouth daily.   clopidogrel (PLAVIX) 75 MG tablet Take 1 tablet (75 mg total) by mouth daily.   vitamin B-12 (CYANOCOBALAMIN) 1000 MCG tablet Take 1,000 mcg by mouth daily.     Allergies:   Patient has no known allergies.   Social History   Socioeconomic History   Marital status: Married    Spouse name: Not on file   Number of children: Not on file   Years of education: Not on file   Highest education level: Professional school degree (e.g., MD, DDS, DVM, JD)  Occupational History   Occupation: pharmacist  Tobacco Use   Smoking status: Never   Smokeless tobacco: Never   Tobacco comments:    pt states he has smoked a few cigarettes in his lifetime only   Substance and Sexual Activity   Alcohol use: Yes    Comment: social   Drug use: No   Sexual activity: Not on file  Other Topics Concern   Not on file  Social History Narrative   Not on file   Social Determinants of Health  Financial Resource Strain: Low Risk  (08/21/2021)   Overall Financial Resource Strain (CARDIA)    Difficulty of Paying Living Expenses: Not hard at all  Food Insecurity: No Food Insecurity (08/21/2021)   Hunger Vital Sign    Worried About Running Out of Food in the Last Year: Never true    Ran Out of Food in the Last Year: Never true  Transportation Needs: No Transportation Needs (08/21/2021)   PRAPARE - Hydrologist (Medical): No    Lack of Transportation (Non-Medical): No  Physical Activity: Insufficiently Active (08/21/2021)   Exercise Vital Sign    Days of Exercise per Week: 1 day    Minutes of Exercise per Session: 20 min   Stress: No Stress Concern Present (08/21/2021)   [place]    Feeling of Stress : Not at all  Social Connections: Moderately Isolated (08/21/2021)   Social Connection and Isolation Panel [NHANES]    Frequency of Communication with Friends and Family: Once a week    Frequency of Social Gatherings with Friends and Family: Once a week    Attends Religious Services: 1 to 4 times per year    Active Member of [employer] or Organizations: No    Attends Music therapist: Not on file    Marital Status: Married     Family History: The patient's family history includes Arthritis in his mother; Dementia in his mother; Heart disease in his father and mother; Hyperlipidemia in his mother; Hypertension in his mother. There is no history of Colon cancer, Colon polyps, Esophageal cancer, Rectal cancer, Stomach cancer, or Stroke.  ROS:   Please see the history of present illness.    All other systems reviewed and are negative.  EKGs/Labs/Other Studies Reviewed:    The following studies were reviewed today:  Echo with Bubble Study  12/01/2021:  1. Left ventricular ejection fraction, by estimation, is 60 to 65%. The  left ventricle has normal function. The left ventricle has no regional  wall motion abnormalities. Left ventricular diastolic parameters are  consistent with Grade I diastolic  dysfunction (impaired relaxation).   2. Right ventricular systolic function is normal. The right ventricular  size is normal. There is normal pulmonary artery systolic pressure.   3. Left atrial size was mildly dilated.   4. The mitral valve is normal in structure. Trivial mitral valve  regurgitation. No evidence of mitral stenosis.   5. The aortic valve is normal in structure. Aortic valve regurgitation is  not visualized. No aortic stenosis is present.   6. Aortic dilatation noted. There is borderline dilatation of the  ascending aorta,  measuring 38 mm. There is moderate dilatation of the  aortic root, measuring 45 mm.   7. The inferior vena cava is normal in size with greater than 50%  respiratory variability, suggesting right atrial pressure of 3 mmHg.   8. Agitated saline contrast bubble study was negative, with no evidence  of any interatrial shunt.   12/01/21: ECHO with bubble   1. Left ventricular ejection fraction, by estimation, is 60 to 65%. The  left ventricle has normal function. The left ventricle has no regional  wall motion abnormalities. Left ventricular diastolic parameters are  consistent with Grade I diastolic  dysfunction (impaired relaxation).   2. Right ventricular systolic function is normal. The right ventricular  size is normal. There is normal pulmonary artery systolic pressure.   3. Left atrial size was mildly  dilated.   4. The mitral valve is normal in structure. Trivial mitral valve  regurgitation. No evidence of mitral stenosis.   5. The aortic valve is normal in structure. Aortic valve regurgitation is  not visualized. No aortic stenosis is present.   6. Aortic dilatation noted. There is borderline dilatation of the  ascending aorta, measuring 38 mm. There is moderate dilatation of the  aortic root, measuring 45 mm.   7. The inferior vena cava is normal in size with greater than 50%  respiratory variability, suggesting right atrial pressure of 3 mmHg.   8. Agitated saline contrast bubble study was negative, with no evidence  of any interatrial shunt.  CTA Head/Neck  12/01/2021: IMPRESSION: 1. No intracranial large vessel occlusion. Overall unchanged atherosclerotic disease in the left P1 and P2 and right P3 segments. 2.  No hemodynamically significant stenosis in the neck. 3.  No acute intracranial process.  EKG:  EKG is personally reviewed and interpreted. 01/30/2022: EKG was not ordered.   Recent Labs: 12/01/2021: ALT 46; BUN 10; Creatinine, Ser 0.81; Hemoglobin 15.0; Magnesium  2.2; Platelets 228; Potassium 3.4; Sodium 135   Recent Lipid Panel    Component Value Date/Time   CHOL 116 12/01/2021 0346   TRIG 121 12/01/2021 0346   HDL 34 (L) 12/01/2021 0346   CHOLHDL 3.4 12/01/2021 0346   VLDL 24 12/01/2021 0346   LDLCALC 58 12/01/2021 0346   LDLDIRECT 171.0 12/25/2010 0929     Risk Assessment/Calculations:          Physical Exam:    VS:  BP 120/80 (BP Location: Left Arm, Patient Position: Sitting, Cuff Size: Normal)   Pulse 91   Ht 6' (1.829 m)   Wt 241 lb (109.3 kg)   SpO2 94%   BMI 32.69 kg/m     Wt Readings from Last 3 Encounters:  01/30/22 241 lb (109.3 kg)  01/21/22 245 lb 6.4 oz (111.3 kg)  12/17/21 243 lb (110.2 kg)     GEN: Well nourished, well developed in no acute distress HEENT: Normal NECK: No JVD; No carotid bruits LYMPHATICS: No lymphadenopathy CARDIAC: RRR with one skipped beat, no murmurs, rubs, gallops RESPIRATORY:  Clear to auscultation without rales, wheezing or rhonchi  ABDOMEN: Soft, non-tender, non-distended MUSCULOSKELETAL:  No edema; No deformity  SKIN: Warm and dry NEUROLOGIC:  Alert and oriented x 3 PSYCHIATRIC:  Normal affect   ASSESSMENT:    1. Cerebrovascular accident (CVA), unspecified mechanism ([place])   2. Primary hypertension   3. Hyperlipidemia, unspecified hyperlipidemia type   4. Abnormal echocardiogram   5. Dilated aortic root (HCC)    PLAN:    In order of problems listed above:  Stroke (3 prior strokes in total-no residual symptoms) - We will check a Zio patch monitor to exclude atrial fibrillation. -Continue with Plavix 85 mg as well as atorvastatin 40 mg high intensity statin.  LDL 58.  Protective.  Dilated ascending aorta - 45 mm detected on echocardiogram.  I will check a CT scan of chest with contrast to verify or more accurately measure the aorta.  There is a chance that this was artifactual. -If dilated, we will repeat scan in 1 year.  Hypertension - Under excellent control.   Continue with amlodipine.  5 mg.  Follow-up: TBD based on results of testing.   Medication Adjustments/Labs and Tests Ordered: Current medicines are reviewed at length with the patient today.  Concerns regarding medicines are outlined above.   Orders Placed This Encounter  Procedures  CT ANGIO CHEST AORTA W/CM & OR WO/CM   LONG TERM MONITOR (3-14 DAYS)   No orders of the defined types were placed in this encounter.  Patient Instructions  Medication Instructions:  The current medical regimen is effective;  continue present plan and medications.  *If you need a refill on your cardiac medications before your next appointment, please call your pharmacy*  Testing/Procedures: Cardiac CT Angiography (CTA), is a special type of CT scan that uses a computer to produce multi-dimensional views of major blood vessels throughout the body. In CT angiography, a contrast material is injected through an IV to help visualize the blood vessels.  This will be completed at [facility] and you will be contacted to be scheduled.  ZIO XT- Long Term Monitor Instructions  Your physician has requested you wear a ZIO patch monitor for 14 days.  This is a single patch monitor. [employer] supplies one patch monitor per enrollment. Additional stickers are not available. Please do not apply patch if you will be having a Nuclear Stress Test,  Echocardiogram, Cardiac CT, MRI, or Chest Xray during the period you would be wearing the  monitor. The patch cannot be worn during these tests. You cannot remove and re-apply the  ZIO XT patch monitor.  Your ZIO patch monitor will be mailed 3 day USPS to your address on file. It may take 3-5 days  to receive your monitor after you have been enrolled.  Once you have received your monitor, please review the enclosed instructions. Your monitor  has already been registered assigning a specific monitor serial # to you.  Billing and Patient Assistance Program Information  We  have supplied [employer] with any of your insurance information on file for billing purposes. [employer] offers a sliding scale Patient Assistance Program for patients that do not have  insurance, or whose insurance does not completely cover the cost of the ZIO monitor.  You must apply for the Patient Assistance Program to qualify for this discounted rate.  To apply, please call [employer] at [phone], select option 4, select option 2, ask to apply for  Patient Assistance Program. Theodore Demark will ask your household income, and how many people  are in your household. They will quote your out-of-pocket cost based on that information.  [employer] will also be able to set up a 18-month interest-free payment plan if needed.  Applying the monitor   Shave hair from upper left chest.  Hold abrader disc by orange tab. Rub abrader in 40 strokes over the upper left chest as  indicated in your monitor instructions.  Clean area with 4 enclosed alcohol pads. Let dry.  Apply patch as indicated in monitor instructions. Patch will be placed under collarbone on left  side of chest with arrow pointing upward.  Rub patch adhesive wings for 2 minutes. Remove white label marked "1". Remove the white  label marked "2". Rub patch adhesive wings for 2 additional minutes.  While looking in a mirror, press and release button in center of patch. A small green light will  flash 3-4 times. This will be your only indicator that the monitor has been turned on.  Do not shower for the first 24 hours. You may shower after the first 24 hours.  Press the button if you feel a symptom. You will hear a small click. Record Date, Time and  Symptom in the Patient Logbook.  When you are ready to remove the patch, follow instructions on the last 2 pages of Patient  Logbook. Stick patch monitor onto the last page of Patient Logbook.  Place Patient Logbook in the blue and white box. Use locking tab on box and tape box closed  securely. The blue  and white box has prepaid postage on it. Please place it in the mailbox as  soon as possible. Your physician should have your test results approximately 7 days after the  monitor has been mailed back to [facility].  Call [place] at [phone] if you have questions regarding  your ZIO XT patch monitor. Call them immediately if you see an orange light blinking on your  monitor.  If your monitor falls off in less than 4 days, contact our Monitor department at [phone].  If your monitor becomes loose or falls off after 4 days call [employer] at [phone] for  suggestions on securing your monitor  Follow-Up: At [facility], you and your health needs are our priority.  As part of our continuing mission to provide you with exceptional heart care, we have created designated Provider Care Teams.  These Care Teams include your primary Cardiologist (physician) and Advanced Practice Providers (APPs -  Physician Assistants and Nurse Practitioners) who all work together to provide you with the care you need, when you need it.  We recommend signing up for the patient portal called "MyChart".  Sign up information is provided on this After Visit Summary.  MyChart is used to connect with patients for Virtual Visits (Telemedicine).  Patients are able to view lab/test results, encounter notes, upcoming appointments, etc.  Non-urgent messages can be sent to your provider as well.   To learn more about what you can do with MyChart, go to [email or web address].    Your next appointment:   Follow up will be based on the results of the above testing.   Important Information About Sugar         I,Mathew Stumpf,acting as a scribe for Candee Furbish, MD.,have documented all relevant documentation on the behalf of Candee Furbish, MD,as directed by  Candee Furbish, MD while in the presence of Candee Furbish, MD.  I, Candee Furbish, MD, have reviewed all documentation for this visit. The  documentation on 01/30/22 for the exam, diagnosis, procedures, and orders are all accurate and complete.   Signed, Candee Furbish, MD  01/30/2022 4:54 PM     [facility]

## 2022-02-08 DIAGNOSIS — I639 Cerebral infarction, unspecified: Secondary | ICD-10-CM

## 2022-02-19 ENCOUNTER — Ambulatory Visit (INDEPENDENT_AMBULATORY_CARE_PROVIDER_SITE_OTHER): Payer: Managed Care, Other (non HMO) | Admitting: Internal Medicine

## 2022-02-19 ENCOUNTER — Encounter: Payer: Self-pay | Admitting: Internal Medicine

## 2022-02-19 VITALS — BP 110/88 | HR 90 | Temp 97.9°F | Ht 71.0 in | Wt 244.6 lb

## 2022-02-19 DIAGNOSIS — E559 Vitamin D deficiency, unspecified: Secondary | ICD-10-CM | POA: Diagnosis not present

## 2022-02-19 DIAGNOSIS — Z0000 Encounter for general adult medical examination without abnormal findings: Secondary | ICD-10-CM | POA: Diagnosis not present

## 2022-02-19 DIAGNOSIS — I10 Essential (primary) hypertension: Secondary | ICD-10-CM

## 2022-02-19 DIAGNOSIS — I639 Cerebral infarction, unspecified: Secondary | ICD-10-CM | POA: Diagnosis not present

## 2022-02-19 DIAGNOSIS — E785 Hyperlipidemia, unspecified: Secondary | ICD-10-CM

## 2022-02-19 DIAGNOSIS — Z86010 Personal history of colonic polyps: Secondary | ICD-10-CM

## 2022-02-19 LAB — COMPREHENSIVE METABOLIC PANEL
ALT: 49 U/L (ref 0–53)
AST: 26 U/L (ref 0–37)
Albumin: 4.7 g/dL (ref 3.5–5.2)
Alkaline Phosphatase: 79 U/L (ref 39–117)
BUN: 13 mg/dL (ref 6–23)
CO2: 26 mEq/L (ref 19–32)
Calcium: 9.6 mg/dL (ref 8.4–10.5)
Chloride: 102 mEq/L (ref 96–112)
Creatinine, Ser: 0.79 mg/dL (ref 0.40–1.50)
GFR: 98.3 mL/min (ref >60.00)
Glucose, Bld: 75 mg/dL (ref 70–99)
Potassium: 3.9 mEq/L (ref 3.5–5.1)
Sodium: 138 mEq/L (ref 135–145)
Total Bilirubin: 0.8 mg/dL (ref 0.2–1.2)
Total Protein: 7.4 g/dL (ref 6.0–8.3)

## 2022-02-19 LAB — CBC WITH DIFFERENTIAL/PLATELET
Basophils Absolute: 0.1 10*3/uL (ref 0.0–0.1)
Basophils Relative: 0.7 % (ref 0.0–3.0)
Eosinophils Absolute: 0.3 10*3/uL (ref 0.0–0.7)
Eosinophils Relative: 3.8 % (ref 0.0–5.0)
HCT: 45 % (ref 39.0–52.0)
Hemoglobin: 15.5 g/dL (ref 13.0–17.0)
Lymphocytes Relative: 26.8 % (ref 12.0–46.0)
Lymphs Abs: 2.1 10*3/uL (ref 0.7–4.0)
MCHC: 34.5 g/dL (ref 30.0–36.0)
MCV: 87.6 fl (ref 78.0–100.0)
Monocytes Absolute: 0.5 10*3/uL (ref 0.1–1.0)
Monocytes Relative: 6.5 % (ref 3.0–12.0)
Neutro Abs: 4.8 10*3/uL (ref 1.4–7.7)
Neutrophils Relative %: 62.2 % (ref 43.0–77.0)
Platelets: 239 10*3/uL (ref 150.0–400.0)
RBC: 5.14 Mil/uL (ref 4.22–5.81)
RDW: 13.4 % (ref 11.5–15.5)
WBC: 7.7 10*3/uL (ref 4.0–10.5)

## 2022-02-19 LAB — HEMOGLOBIN A1C: Hgb A1c MFr Bld: 5.5 % (ref 4.6–6.5)

## 2022-02-19 LAB — VITAMIN D 25 HYDROXY (VIT D DEFICIENCY, FRACTURES): VITD: 22.58 ng/mL — ABNORMAL LOW (ref 30.00–100.00)

## 2022-02-19 LAB — TSH: TSH: 0.98 u[IU]/mL (ref 0.35–5.50)

## 2022-02-19 LAB — VITAMIN B12: Vitamin B-12: 406 pg/mL (ref 211–911)

## 2022-02-19 LAB — PSA: PSA: 1.72 ng/mL (ref 0.10–4.00)

## 2022-02-19 LAB — MAGNESIUM: Magnesium: 2.1 mg/dL (ref 1.5–2.5)

## 2022-02-19 MED ORDER — ATORVASTATIN CALCIUM 40 MG PO TABS: 40.0000 mg | ORAL_TABLET | Freq: Every day | ORAL | 0 refills | Status: DC

## 2022-02-19 MED ORDER — CLOPIDOGREL BISULFATE 75 MG PO TABS: 75.0000 mg | ORAL_TABLET | Freq: Every day | ORAL | 2 refills | Status: DC

## 2022-02-19 MED ORDER — AMLODIPINE BESYLATE 5 MG PO TABS: 5.0000 mg | ORAL_TABLET | Freq: Every day | ORAL | 0 refills | Status: DC

## 2022-02-19 NOTE — Progress Notes (Signed)
Established Patient Office Visit     CC/Reason for Visit: Annual preventive exam  HPI: Dylan Fuller is a 56 y.o. male who is coming in today for the above mentioned reasons. Past Medical History is significant for: Hypertension, hyperlipidemia and history of CVA.  He feels well and has no acute concerns or complaints.  He has routine eye and dental care.  He will get his flu vaccine at work.  Colonoscopy in 2018.   Past Medical/Surgical History: Past Medical History:  Diagnosis Date   Constipation    CVA (cerebral vascular accident) ([employer])    09/2019   Hyperlipidemia    Hypertension    Obesity (BMI 30.0-34.9)    Prostatitis, chronic     Past Surgical History:  Procedure Laterality Date   HERNIA REPAIR     age 68 - ?ingunial x2    Social History:  reports that he has never smoked. He has never used smokeless tobacco. He reports current alcohol use. He reports that he does not use drugs.  Allergies: No Known Allergies  Family History:  Family History  Problem Relation Age of Onset   Arthritis Mother    Heart disease Mother    Hyperlipidemia Mother    Hypertension Mother    Dementia Mother    Heart disease Father        24; post-rheumatic fever valve disease   Colon cancer Neg Hx    Colon polyps Neg Hx    Esophageal cancer Neg Hx    Rectal cancer Neg Hx    Stomach cancer Neg Hx    Stroke Neg Hx      Current Outpatient Medications:    cholecalciferol (VITAMIN D3) 25 MCG (1000 UNIT) tablet, Take 2,000 Units by mouth daily., Disp: , Rfl:    vitamin B-12 (CYANOCOBALAMIN) 1000 MCG tablet, Take 1,000 mcg by mouth daily., Disp: , Rfl:    amLODipine (NORVASC) 5 MG tablet, Take 1 tablet (5 mg total) by mouth daily., Disp: 90 tablet, Rfl: 0   atorvastatin (LIPITOR) 40 MG tablet, Take 1 tablet (40 mg total) by mouth daily., Disp: 90 tablet, Rfl: 0   clopidogrel (PLAVIX) 75 MG tablet, Take 1 tablet (75 mg total) by mouth daily., Disp: 90 tablet, Rfl: 2  Review of  Systems:  Constitutional: Denies fever, chills, diaphoresis, appetite change and fatigue.  HEENT: Denies photophobia, eye pain, redness, hearing loss, ear pain, congestion, sore throat, rhinorrhea, sneezing, mouth sores, trouble swallowing, neck pain, neck stiffness and tinnitus.   Respiratory: Denies SOB, DOE, cough, chest tightness,  and wheezing.   Cardiovascular: Denies chest pain, palpitations and leg swelling.  Gastrointestinal: Denies nausea, vomiting, abdominal pain, diarrhea, constipation, blood in stool and abdominal distention.  Genitourinary: Denies dysuria, urgency, frequency, hematuria, flank pain and difficulty urinating.  Endocrine: Denies: hot or cold intolerance, sweats, changes in hair or nails, polyuria, polydipsia. Musculoskeletal: Denies myalgias, back pain, joint swelling, arthralgias and gait problem.  Skin: Denies pallor, rash and wound.  Neurological: Denies dizziness, seizures, syncope, weakness, light-headedness, numbness and headaches.  Hematological: Denies adenopathy. Easy bruising, personal or family bleeding history  Psychiatric/Behavioral: Denies suicidal ideation, mood changes, confusion, nervousness, sleep disturbance and agitation    Physical Exam: Vitals:   02/19/22 1355 02/19/22 1358  BP: (!) 140/90 110/88  Pulse: 90   Temp: 97.9 F (36.6 C)   TempSrc: Oral   SpO2: 98%   Weight: 244 lb 9.6 oz (110.9 kg)   Height: '5\' 11"'$  (1.803 m)  Body mass index is 34.11 kg/m.   Constitutional: NAD, calm, comfortable Eyes: PERRL, lids and conjunctivae normal ENMT: Mucous membranes are moist. Posterior pharynx clear of any exudate or lesions. Normal dentition. Tympanic membrane is pearly white, no erythema or bulging. Neck: normal, supple, no masses, no thyromegaly Respiratory: clear to auscultation bilaterally, no wheezing, no crackles. Normal respiratory effort. No accessory muscle use.  Cardiovascular: Regular rate and rhythm, no murmurs / rubs /  gallops. No extremity edema. 2+ pedal pulses. No carotid bruits.  Abdomen: no tenderness, no masses palpated. No hepatosplenomegaly. Bowel sounds positive.  Musculoskeletal: no clubbing / cyanosis. No joint deformity upper and lower extremities. Good ROM, no contractures. Normal muscle tone.  Skin: no rashes, lesions, ulcers. No induration Neurologic: CN 2-12 grossly intact. Sensation intact, DTR normal. Strength 5/5 in all 4.  Psychiatric: Normal judgment and insight. Alert and oriented x 3. Normal mood.   [place] Office Visit from 02/19/2022 in [place] [facility] [place]  PHQ-9 Total Score 1         Impression and Plan:  Encounter for preventive health examination  Essential hypertension - Plan: amLODipine (NORVASC) 5 MG tablet  Hyperlipidemia, unspecified hyperlipidemia type - Plan: atorvastatin (LIPITOR) 40 MG tablet  Primary hypertension  Cerebrovascular accident (CVA), unspecified mechanism ([place]) - Plan: CBC with Differential/Platelet, Comprehensive metabolic panel, PSA, Magnesium, Hemoglobin A1c, Vitamin B12, TSH  History of colonic polyps  Vitamin D deficiency - Plan: VITAMIN D 25 Hydroxy (Vit-D Deficiency, Fractures)  -Recommend routine eye and dental care. -Immunizations: He will get his flu vaccination at work, all other immunizations are up-to-date -Healthy lifestyle discussed in detail. -Labs to be updated today. -Colon cancer screening: 11/2016, 7-year follow-up -Breast cancer screening: Not applicable -Cervical cancer screening: Not applicable -Lung cancer screening: Never smoker, not applicable -Prostate cancer screening: PSA today -DEXA: Not applicable   [facility] [place] Isaac Bliss, MD [facility]

## 2022-02-23 ENCOUNTER — Other Ambulatory Visit: Payer: Self-pay | Admitting: Internal Medicine

## 2022-02-23 DIAGNOSIS — E559 Vitamin D deficiency, unspecified: Secondary | ICD-10-CM

## 2022-02-23 MED ORDER — VITAMIN D (ERGOCALCIFEROL) 1.25 MG (50000 UNIT) PO CAPS: 50000.0000 [IU] | ORAL_CAPSULE | ORAL | 0 refills | Status: DC

## 2022-02-24 ENCOUNTER — Other Ambulatory Visit: Payer: Self-pay | Admitting: *Deleted

## 2022-02-24 DIAGNOSIS — E559 Vitamin D deficiency, unspecified: Secondary | ICD-10-CM

## 2022-02-27 ENCOUNTER — Other Ambulatory Visit: Payer: Self-pay | Admitting: *Deleted

## 2022-02-27 ENCOUNTER — Ambulatory Visit (HOSPITAL_BASED_OUTPATIENT_CLINIC_OR_DEPARTMENT_OTHER): Admission: RE | Admit: 2022-02-27 | Payer: Managed Care, Other (non HMO) | Source: Ambulatory Visit

## 2022-02-27 DIAGNOSIS — R931 Abnormal findings on diagnostic imaging of heart and coronary circulation: Secondary | ICD-10-CM

## 2022-02-27 DIAGNOSIS — I77810 Thoracic aortic ectasia: Secondary | ICD-10-CM

## 2022-03-05 ENCOUNTER — Encounter: Payer: Managed Care, Other (non HMO) | Admitting: Internal Medicine

## 2022-03-27 ENCOUNTER — Ambulatory Visit (HOSPITAL_BASED_OUTPATIENT_CLINIC_OR_DEPARTMENT_OTHER)
Admission: RE | Admit: 2022-03-27 | Discharge: 2022-03-27 | Disposition: A | Discharge status: Home / Self Care | Payer: Managed Care, Other (non HMO) | Source: Ambulatory Visit | Attending: Cardiology | Admitting: Cardiology

## 2022-03-27 DIAGNOSIS — R931 Abnormal findings on diagnostic imaging of heart and coronary circulation: Secondary | ICD-10-CM | POA: Insufficient documentation

## 2022-03-27 DIAGNOSIS — I77810 Thoracic aortic ectasia: Secondary | ICD-10-CM | POA: Insufficient documentation

## 2022-03-27 MED ORDER — IOHEXOL 350 MG/ML SOLN
100.0000 mL | Freq: Once | INTRAVENOUS | Status: AC | PRN
Start: 2022-03-27 — End: 2022-03-27
  Administered 2022-03-27: 100 mL via INTRAVENOUS

## 2022-04-10 ENCOUNTER — Telehealth: Payer: Self-pay | Admitting: *Deleted

## 2022-04-10 DIAGNOSIS — I77810 Thoracic aortic ectasia: Secondary | ICD-10-CM

## 2022-04-10 NOTE — Telephone Encounter (Signed)
Ascending aorta is 21m  Aortic root at sinus of Valsalva is 48-519m Coronary artery calcifications noted (on atorvastatin)   Let's ECHO in one year to demonstrate stability.  MaCandee FurbishMD   Orders placed.  Pt has reviewed results via MyChart

## 2022-04-29 ENCOUNTER — Telehealth: Payer: Self-pay | Admitting: Internal Medicine

## 2022-04-29 NOTE — Telephone Encounter (Signed)
Pt requesting labs to check his vitamin d as he is taking high doses to raise his level

## 2022-04-29 NOTE — Telephone Encounter (Signed)
Okay to schedule.  Order placed.

## 2022-05-15 ENCOUNTER — Other Ambulatory Visit (INDEPENDENT_AMBULATORY_CARE_PROVIDER_SITE_OTHER): Payer: Managed Care, Other (non HMO)

## 2022-05-15 DIAGNOSIS — E559 Vitamin D deficiency, unspecified: Secondary | ICD-10-CM | POA: Diagnosis not present

## 2022-05-15 LAB — VITAMIN D 25 HYDROXY (VIT D DEFICIENCY, FRACTURES): VITD: 38.16 ng/mL (ref 30.00–100.00)

## 2022-05-16 ENCOUNTER — Encounter: Payer: Self-pay | Admitting: Internal Medicine

## 2022-05-18 ENCOUNTER — Other Ambulatory Visit: Payer: Self-pay | Admitting: Internal Medicine

## 2022-05-18 DIAGNOSIS — E559 Vitamin D deficiency, unspecified: Secondary | ICD-10-CM

## 2022-05-20 ENCOUNTER — Ambulatory Visit (INDEPENDENT_AMBULATORY_CARE_PROVIDER_SITE_OTHER): Payer: Managed Care, Other (non HMO) | Admitting: Neurology

## 2022-05-20 ENCOUNTER — Encounter: Payer: Self-pay | Admitting: Neurology

## 2022-05-20 VITALS — BP 129/86 | HR 88 | Ht 72.0 in | Wt 240.8 lb

## 2022-05-20 DIAGNOSIS — Z9189 Other specified personal risk factors, not elsewhere classified: Secondary | ICD-10-CM | POA: Diagnosis not present

## 2022-05-20 DIAGNOSIS — R0683 Snoring: Secondary | ICD-10-CM

## 2022-05-20 DIAGNOSIS — E66811 Obesity, class 1: Secondary | ICD-10-CM

## 2022-05-20 DIAGNOSIS — I639 Cerebral infarction, unspecified: Secondary | ICD-10-CM

## 2022-05-20 DIAGNOSIS — E669 Obesity, unspecified: Secondary | ICD-10-CM

## 2022-05-20 DIAGNOSIS — I6381 Other cerebral infarction due to occlusion or stenosis of small artery: Secondary | ICD-10-CM

## 2022-05-20 DIAGNOSIS — G4733 Obstructive sleep apnea (adult) (pediatric): Secondary | ICD-10-CM

## 2022-05-20 NOTE — Patient Instructions (Signed)

## 2022-05-20 NOTE — [facility] (Addendum)
Subjective:    Patient ID: Dylan Fuller is a 56 y.o. male.  HPI    [name], MD, PhD [facility] [address], [address]O. [place], [place] [address]  Dear Mamie Nick,  I saw your patient, Dylan Fuller, upon your kind request in my sleep clinic today for initial consultation of his sleep disorder, in particular, concern for underlying obstructive sleep apnea.  The patient is accompanied by his wife today.  As you know, Dylan Fuller is a 56 year old male with an underlying medical history of hypertension, hyperlipidemia, prostatitis, history of stroke and April 2021, history of hypertensive brain hemorrhage, history of stroke in June 2023, and mild obesity, who reports snoring and sleep apnea. He tested positive for sleep apnea during an inpatient evaluation as part of a study.  He has a copy of his sleep smart test results which indicated an REI of 24.5/h, O2 nadir 82%, testing was done on 12/01/2021.  He had trouble tolerating CPAP at the time.  His Epworth sleepiness score is 2 out of 20, fatigue severity score is 9 out of 63.  He is working on weight loss. I reviewed your office note from 01/21/2022. He works full-time as a Software engineer, he feels back to baseline.  He has no family history of sleep apnea.  He lives with his wife and their son lives with them, he is a Administrator, arts at [employer].  Bedtime is generally between 10 and 10:30 PM and rise time around 6:15 AM.  He denies night to night nocturia or recurrent morning or nocturnal headaches.  He drinks limited caffeine, about 2 cups in the morning, no daily soda or tea, occasional alcohol in the form of beer, non-smoker.  His Past Medical History Is Significant For: Past Medical History:  Diagnosis Date   Constipation    CVA (cerebral vascular accident) ([facility])    09/2019   Hyperlipidemia    Hypertension    Obesity (BMI 30.0-34.9)    Prostatitis, chronic     His Past Surgical History Is Significant For: Past  Surgical History:  Procedure Laterality Date   HERNIA REPAIR     age 54 - ?ingunial x2    His Family History Is Significant For: Family History  Problem Relation Age of Onset   Arthritis Mother    Heart disease Mother    Hyperlipidemia Mother    Hypertension Mother    Dementia Mother    Heart disease Father        17; post-rheumatic fever valve disease   Colon cancer Neg Hx    Colon polyps Neg Hx    Esophageal cancer Neg Hx    Rectal cancer Neg Hx    Stomach cancer Neg Hx    Stroke Neg Hx    Sleep apnea Neg Hx     His Social History Is Significant For: Social History   Socioeconomic History   Marital status: Married    Spouse name: Not on file   Number of children: Not on file   Years of education: Not on file   Highest education level: Professional school degree (e.g., MD, DDS, DVM, JD)  Occupational History   Occupation: pharmacist  Tobacco Use   Smoking status: Never   Smokeless tobacco: Never   Tobacco comments:    pt states he has smoked a few cigarettes in his lifetime only   Substance and Sexual Activity   Alcohol use: Yes    Comment: OCC   Drug use: No  Sexual activity: Not on file  Other Topics Concern   Not on file  Social History Narrative   Not on file   Social Determinants of Health   Financial Resource Strain: Low Risk  (08/21/2021)   Overall Financial Resource Strain (CARDIA)    Difficulty of Paying Living Expenses: Not hard at all  Food Insecurity: No Food Insecurity (08/21/2021)   Hunger Vital Sign    Worried About Running Out of Food in the Last Year: Never true    Ran Out of Food in the Last Year: Never true  Transportation Needs: No Transportation Needs (08/21/2021)   PRAPARE - Hydrologist (Medical): No    Lack of Transportation (Non-Medical): No  Physical Activity: Insufficiently Active (08/21/2021)   Exercise Vital Sign    Days of Exercise per Week: 1 day    Minutes of Exercise per Session: 20 min  Stress:  No Stress Concern Present (08/21/2021)   [place]    Feeling of Stress : Not at all  Social Connections: Moderately Isolated (08/21/2021)   Social Connection and Isolation Panel [NHANES]    Frequency of Communication with Friends and Family: Once a week    Frequency of Social Gatherings with Friends and Family: Once a week    Attends Religious Services: 1 to 4 times per year    Active Member of [employer] or Organizations: No    Attends Music therapist: Not on file    Marital Status: Married    His Allergies Are:  No Known Allergies:   His Current Medications Are:  Outpatient Encounter Medications as of 05/20/2022  Medication Sig   Acetaminophen (TYLENOL PO) Take by mouth as needed.   amLODipine (NORVASC) 5 MG tablet Take 1 tablet (5 mg total) by mouth daily.   atorvastatin (LIPITOR) 40 MG tablet Take 1 tablet (40 mg total) by mouth daily.   cholecalciferol (VITAMIN D3) 25 MCG (1000 UNIT) tablet Take 2,000 Units by mouth daily.   clopidogrel (PLAVIX) 75 MG tablet Take 1 tablet (75 mg total) by mouth daily.   vitamin B-12 (CYANOCOBALAMIN) 1000 MCG tablet Take 1,000 mcg by mouth daily.   Vitamin D, Ergocalciferol, (DRISDOL) 1.25 MG (50000 UNIT) CAPS capsule TAKE 1 CAPSULE BY MOUTH ONCE WEEKLY FOR 12 DOSES   No facility-administered encounter medications on file as of 05/20/2022.  :   Review of Systems:  Out of a complete 14 point review of systems, all are reviewed and negative with the exception of these symptoms as listed below:   Review of Systems  Neurological:        Pt here for sleep consult Pt states snores, hx of strokes ,controlled BP. Pt states sleep study done in hospital June 2023 with Dr Leonie Fuller . Pt states no CPAP machine    ESS:2 FSS:9    Objective:  Neurological Exam  Physical Exam Physical Examination:   Vitals:   05/20/22 1508  BP: 129/86  Pulse: 88    General Examination: The  patient is a very pleasant 56 y.o. male in no acute distress. He appears well-developed and well-nourished and well groomed.   HEENT: Normocephalic, atraumatic, pupils are equal, round and reactive to light, extraocular tracking is good without limitation to gaze excursion or nystagmus noted. Hearing is grossly intact. Face is symmetric with normal facial animation. Speech is clear with no dysarthria noted. There is no hypophonia. There is no lip, neck/head, jaw or voice  tremor. Neck is supple with full range of passive and active motion. There are no carotid bruits on auscultation. Oropharynx exam reveals: No significant mouth dryness, good dental hygiene, moderate airway crowding secondary to Mallampati class IV, larger uvula, tonsillar size of about 1+, tongue protrudes centrally and palate elevates symmetrically.  Neck circumference 18-1/8 inches.  Minimal overbite noted.  Chest: Clear to auscultation without wheezing, rhonchi or crackles noted.  Heart: S1+S2+0, regular and normal without murmurs, rubs or gallops noted.   Abdomen: Soft, non-tender and non-distended.  Extremities: There is no pitting edema in the distal lower extremities bilaterally.   Skin: Warm and dry without trophic changes noted.   Musculoskeletal: exam reveals no obvious joint deformities.   Neurologically:  Mental status: The patient is awake, alert and oriented in all 4 spheres. His immediate and remote memory, attention, language skills and fund of knowledge are appropriate. There is no evidence of aphasia, agnosia, apraxia or anomia. Speech is clear with normal prosody and enunciation. Thought process is linear. Mood is normal and affect is normal.  Cranial nerves II - XII are as described above under HEENT exam.  Motor exam: Normal bulk, strength and tone is noted. There is no obvious action or resting tremor.  Fine motor skills and coordination: grossly intact.  Cerebellar testing: No dysmetria or intention  tremor. There is no truncal or gait ataxia.  Sensory exam: intact to light touch in the upper and lower extremities.  Gait, station and balance: He stands easily. No veering to one side is noted. No leaning to one side is noted. Posture is age-appropriate and stance is narrow based. Gait shows normal stride length and normal pace. No problems turning are noted.   Assessment and Plan:  In summary, Dylan Fuller is a very pleasant 56 y.o.-year old male with an underlying medical history of hypertension, hyperlipidemia, prostatitis, history of stroke and April 2021, history of hypertensive brain hemorrhage, history of stroke in June 2023, and mild obesity, whose history and physical exam are concerning for sleep disordered breathing, he testing positive for OSA during his hospitalization in June 2023 but could not tolerate CPAP or AutoPap at the time.   I had a long chat with the patient and his wife about my findings and the diagnosis of sleep apnea, particularly OSA, its prognosis and treatment options. We talked about medical/conservative treatments, surgical interventions and non-pharmacological approaches for symptom control. I explained, in particular, the risks and ramifications of untreated moderate to severe OSA, especially with respect to developing cardiovascular disease down the road, including congestive heart failure (CHF), difficult to treat hypertension, cardiac arrhythmias (particularly A-fib), neurovascular complications including TIA, stroke and dementia. Even type 2 diabetes has, in part, been linked to untreated OSA. Symptoms of untreated OSA may include (but may not be limited to) daytime sleepiness, nocturia (i.e. frequent nighttime urination), memory problems, mood irritability and suboptimally controlled or worsening mood disorder such as depression and/or anxiety, lack of energy, lack of motivation, physical discomfort, as well as recurrent headaches, especially morning or nocturnal  headaches. We talked about the importance of maintaining a healthy lifestyle and striving for healthy weight.   I recommended a sleep study at this time. I outlined the differences between a laboratory attended sleep study which is considered more comprehensive and accurate over the option of a home sleep test (HST); the latter may lead to underestimation of sleep disordered breathing in some instances and does not help with diagnosing upper airway resistance syndrome and  is not accurate enough to diagnose primary central sleep apnea typically. I outlined possible surgical and non-surgical treatment options of OSA, including the use of a positive airway pressure (PAP) device (i.e. CPAP, AutoPAP/APAP or BiPAP in certain circumstances), a custom-made dental device (aka oral appliance, which would require a referral to a specialist dentist or orthodontist typically, and is generally speaking not considered for patients with full dentures or edentulous state), upper airway surgical options, such as traditional UPPP (which is not considered a first-line treatment) or the Inspire device (hypoglossal nerve stimulator, which would involve a referral for consultation with an ENT surgeon, after careful selection, following inclusion criteria - also not first-line treatment). I explained the PAP treatment option to the patient in detail, as this is generally considered first-line treatment.  The patient indicated that he would be reluctant but willing to try PAP therapy, if the need arises. I explained the importance of being compliant with PAP treatment, not only for insurance purposes but primarily to improve patient's symptoms symptoms, and for the patient's long term health benefit, including to reduce His cardiovascular risks longer-term.    We will pick up our discussion about the next steps and treatment options after testing.  We will keep them posted as to the test results by phone call and/or MyChart messaging  where possible.  We will plan to follow-up in sleep clinic accordingly as well.  I answered all their questions today and the patient and his wife were in agreement.   I encouraged them to call with any interim questions, concerns, problems or updates or email [place] through [place].  Generally speaking, sleep test authorizations may take up to 2 weeks, sometimes less, sometimes longer, the patient is encouraged to get in touch with [place] if they do not hear back from the sleep lab staff directly within the next 2 weeks.  Thank you very much for allowing me to participate in the care of this nice patient. If I can be of any further assistance to you please do not hesitate to talk to me.  Sincerely,   [name], MD, PhD

## 2022-07-02 ENCOUNTER — Telehealth: Payer: Self-pay | Admitting: Neurology

## 2022-07-02 NOTE — Telephone Encounter (Signed)
[employer] denied the NPSG.  HST- [employer] no auth req via auto machine ref # [id number]

## 2022-07-07 ENCOUNTER — Other Ambulatory Visit: Payer: Self-pay | Admitting: Internal Medicine

## 2022-07-07 DIAGNOSIS — I10 Essential (primary) hypertension: Secondary | ICD-10-CM

## 2022-07-07 DIAGNOSIS — E785 Hyperlipidemia, unspecified: Secondary | ICD-10-CM

## 2022-07-27 NOTE — Telephone Encounter (Signed)
Spoke with patient this afternoon about schedule home sleep study. Pt states he needs to check his schedule and call back when ready. Will wait for patient to call sleep lab.

## 2022-08-11 ENCOUNTER — Other Ambulatory Visit: Payer: Self-pay | Admitting: Internal Medicine

## 2022-08-11 DIAGNOSIS — E559 Vitamin D deficiency, unspecified: Secondary | ICD-10-CM

## 2022-08-27 ENCOUNTER — Ambulatory Visit: Payer: Managed Care, Other (non HMO) | Admitting: Internal Medicine

## 2022-12-24 ENCOUNTER — Ambulatory Visit (INDEPENDENT_AMBULATORY_CARE_PROVIDER_SITE_OTHER): Payer: Managed Care, Other (non HMO) | Admitting: Internal Medicine

## 2022-12-24 ENCOUNTER — Encounter: Payer: Self-pay | Admitting: Internal Medicine

## 2022-12-24 ENCOUNTER — Ambulatory Visit: Payer: Managed Care, Other (non HMO) | Admitting: Internal Medicine

## 2022-12-24 VITALS — BP 110/80 | HR 94 | Temp 97.4°F | Wt 239.1 lb

## 2022-12-24 DIAGNOSIS — I10 Essential (primary) hypertension: Secondary | ICD-10-CM

## 2022-12-24 DIAGNOSIS — I639 Cerebral infarction, unspecified: Secondary | ICD-10-CM

## 2022-12-24 DIAGNOSIS — E559 Vitamin D deficiency, unspecified: Secondary | ICD-10-CM

## 2022-12-24 DIAGNOSIS — H6123 Impacted cerumen, bilateral: Secondary | ICD-10-CM | POA: Diagnosis not present

## 2022-12-24 DIAGNOSIS — E785 Hyperlipidemia, unspecified: Secondary | ICD-10-CM | POA: Diagnosis not present

## 2022-12-24 DIAGNOSIS — E669 Obesity, unspecified: Secondary | ICD-10-CM | POA: Diagnosis not present

## 2022-12-24 MED ORDER — CLOPIDOGREL BISULFATE 75 MG PO TABS
75.0000 mg | ORAL_TABLET | Freq: Every day | ORAL | 2 refills | Status: DC
Start: 2022-12-24 — End: 2023-08-26

## 2022-12-24 MED ORDER — ATORVASTATIN CALCIUM 40 MG PO TABS
40.0000 mg | ORAL_TABLET | Freq: Every day | ORAL | 1 refills | Status: DC
Start: 2022-12-24 — End: 2023-08-26

## 2022-12-24 MED ORDER — CLOPIDOGREL BISULFATE 75 MG PO TABS
75.0000 mg | ORAL_TABLET | Freq: Every day | ORAL | 0 refills | Status: DC
Start: 2022-12-24 — End: 2023-08-26

## 2022-12-24 MED ORDER — AMLODIPINE BESYLATE 5 MG PO TABS: 5.0000 mg | ORAL_TABLET | Freq: Every day | ORAL | 1 refills | Status: DC

## 2022-12-24 NOTE — Assessment & Plan Note (Signed)
Recheck vitamin D with next lab draw.

## 2022-12-24 NOTE — Progress Notes (Signed)
Established Patient Office Visit     CC/Reason for Visit: 54-month follow-up chronic conditions  HPI: Dylan Fuller is a 57 y.o. male who is coming in today for the above mentioned reasons. Past Medical History is significant for: Hypertension, hyperlipidemia, vitamin D deficiency, history of CVA.  He has been compliant with medication.  Feeling well and has no acute concerns or complaints.  He will be going to the beach and would like to make sure he does not have significant earwax as this usually causes ear pain for him.   Past Medical/Surgical History: Past Medical History:  Diagnosis Date   Constipation    CVA (cerebral vascular accident) ([facility])    09/2019   Hyperlipidemia    Hypertension    Obesity (BMI 30.0-34.9)    Prostatitis, chronic     Past Surgical History:  Procedure Laterality Date   HERNIA REPAIR     age 58 - ?ingunial x2    Social History:  reports that he has never smoked. He has never used smokeless tobacco. He reports current alcohol use. He reports that he does not use drugs.  Allergies: No Known Allergies  Family History:  Family History  Problem Relation Age of Onset   Arthritis Mother    Heart disease Mother    Hyperlipidemia Mother    Hypertension Mother    Dementia Mother    Heart disease Father        60; post-rheumatic fever valve disease   Colon cancer Neg Hx    Colon polyps Neg Hx    Esophageal cancer Neg Hx    Rectal cancer Neg Hx    Stomach cancer Neg Hx    Stroke Neg Hx    Sleep apnea Neg Hx      Current Outpatient Medications:    Acetaminophen (TYLENOL PO), Take by mouth as needed., Disp: , Rfl:    cholecalciferol (VITAMIN D3) 25 MCG (1000 UNIT) tablet, Take 2,000 Units by mouth daily., Disp: , Rfl:    clopidogrel (PLAVIX) 75 MG tablet, Take 1 tablet (75 mg total) by mouth daily., Disp: 90 tablet, Rfl: 2   clopidogrel (PLAVIX) 75 MG tablet, Take 1 tablet (75 mg total) by mouth daily., Disp: 17 tablet, Rfl: 0   vitamin  B-12 (CYANOCOBALAMIN) 1000 MCG tablet, Take 1,000 mcg by mouth daily., Disp: , Rfl:    Vitamin D, Ergocalciferol, (DRISDOL) 1.25 MG (50000 UNIT) CAPS capsule, TAKE 1 CAPSULE BY MOUTH ONCE WEEKLY FOR 12 DOSES, Disp: 12 capsule, Rfl: 0   amLODipine (NORVASC) 5 MG tablet, Take 1 tablet (5 mg total) by mouth daily., Disp: 90 tablet, Rfl: 1   atorvastatin (LIPITOR) 40 MG tablet, Take 1 tablet (40 mg total) by mouth daily., Disp: 90 tablet, Rfl: 1  Review of Systems:  Negative unless indicated in HPI.   Physical Exam: Vitals:   12/24/22 0919  BP: 110/80  Pulse: 94  Temp: (!) 97.4 F (36.3 C)  TempSrc: Oral  SpO2: 98%  Weight: 239 lb 1.6 oz (108.5 kg)    Body mass index is 32.43 kg/m.   Physical Exam Vitals reviewed.  Constitutional:      Appearance: Normal appearance.  HENT:     Head: Normocephalic and atraumatic.     Right Ear: There is impacted cerumen.     Left Ear: There is impacted cerumen.  Eyes:     Conjunctiva/sclera: Conjunctivae normal.     Pupils: Pupils are equal, round, and reactive to light.  Cardiovascular:     Rate and Rhythm: Normal rate and regular rhythm.  Pulmonary:     Effort: Pulmonary effort is normal.     Breath sounds: Normal breath sounds.  Skin:    General: Skin is warm and dry.  Neurological:     General: No focal deficit present.     Mental Status: He is alert and oriented to person, place, and time.  Psychiatric:        Mood and Affect: Mood normal.        Behavior: Behavior normal.        Thought Content: Thought content normal.        Judgment: Judgment normal.      Impression and Plan:  Essential hypertension Assessment & Plan: Well-controlled on current.  Orders: -     amLODIPine Besylate; Take 1 tablet (5 mg total) by mouth daily.  Dispense: 90 tablet; Refill: 1  Vitamin D deficiency Assessment & Plan: Recheck vitamin D with next lab draw.  Orders: -     VITAMIN D 25 Hydroxy (Vit-D Deficiency, Fractures);  Future  Hyperlipidemia, unspecified hyperlipidemia type Assessment & Plan: On atorvastatin 40 mg daily, total cholesterol 116 with LDL 58, triglycerides 121 and HDL 34.  Orders: -     Atorvastatin Calcium; Take 1 tablet (40 mg total) by mouth daily.  Dispense: 90 tablet; Refill: 1  Obesity (BMI 30.0-34.9) Assessment & Plan: -Discussed healthy lifestyle, including increased physical activity and better food choices to promote weight loss.    Cerebrovascular accident (CVA), unspecified mechanism ([facility]) -     Clopidogrel Bisulfate; Take 1 tablet (75 mg total) by mouth daily.  Dispense: 90 tablet; Refill: 2 -     Clopidogrel Bisulfate; Take 1 tablet (75 mg total) by mouth daily.  Dispense: 17 tablet; Refill: 0  Impacted cerumen, bilateral  Cerumen Desimpaction  -After patient consent was obtained, warm water was applied and gentle ear lavage performed on bilateral ears. There were no complications and following the desimpaction the tympanic membranes were visible. Tympanic membranes are intact following the procedure. Auditory canals are normal. The patient reported relief of symptoms after removal of cerumen.    Time spent:33 minutes reviewing chart, interviewing and examining patient and formulating plan of care.     Chaya Jan, MD [facility]

## 2022-12-24 NOTE — Assessment & Plan Note (Signed)
On atorvastatin 40 mg daily, total cholesterol 116 with LDL 58, triglycerides 121 and HDL 34.

## 2022-12-24 NOTE — Assessment & Plan Note (Signed)
Well controlled on current

## 2022-12-24 NOTE — Assessment & Plan Note (Signed)
Discussed healthy lifestyle, including increased physical activity and better food choices to promote weight loss.  

## 2023-02-26 IMAGING — CT CT ANGIO HEAD-NECK (W OR W/O PERF)
1 of 11 series · 5 of 33 positions shown · IV contrast (OMNI 350)
Comparison: 09/28/2019 CTA head and neck

CLINICAL DATA: Facial droop, acute infarct on MRI

EXAM:
CT ANGIOGRAPHY HEAD AND NECK
TECHNIQUE: Multidetector CT imaging of the head and neck was performed using
the standard protocol during bolus administration of intravenous
contrast. Multiplanar CT image reconstructions and MIPs were
obtained to evaluate the vascular anatomy. Carotid stenosis
measurements (when applicable) are obtained utilizing NASCET
criteria, using the distal internal carotid diameter as the
denominator.

[Series 11: cta neck axial · axial · 0.50mm/px · z∈[-328,-72]mm · 5 of 388 slices shown]
[im 65/388  soft-tissue]
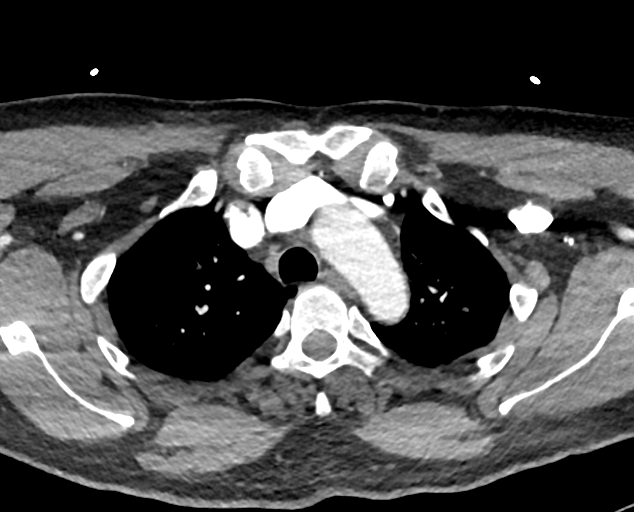
[im 130/388  bone]
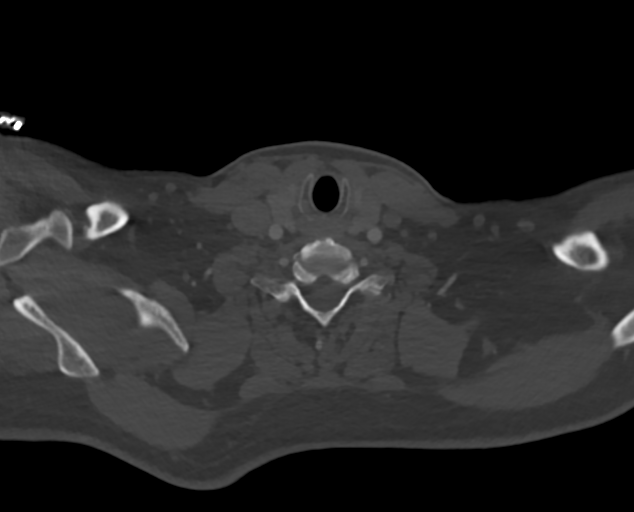
[im 194/388  soft-tissue]
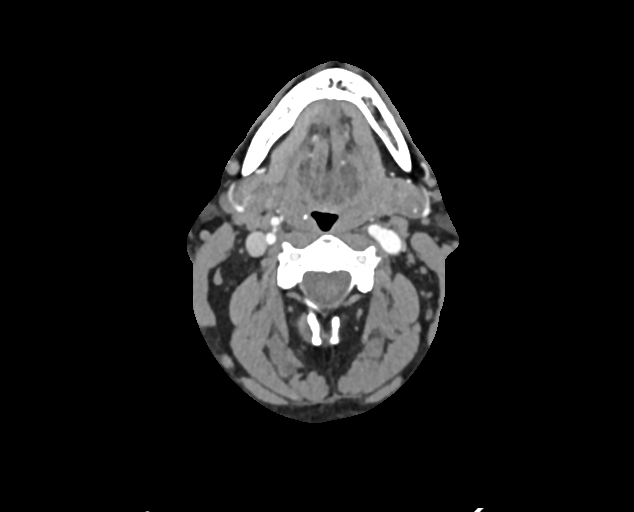
[im 259/388  bone]
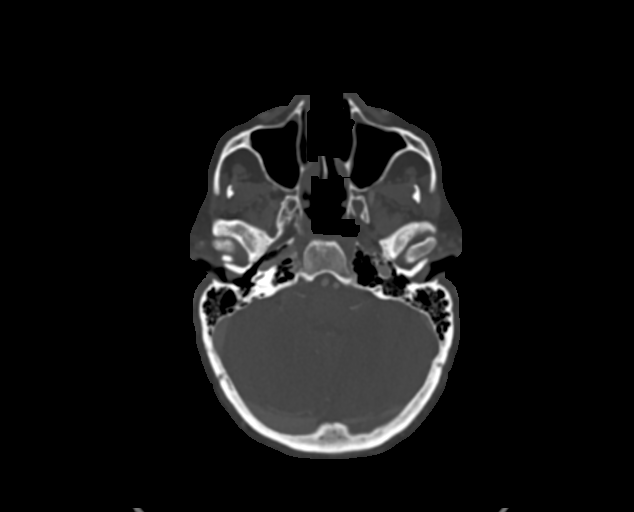
[im 323/388  soft-tissue]
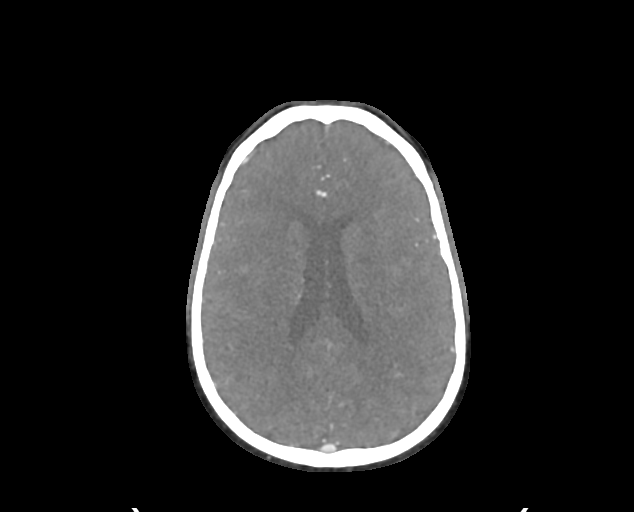

[5 of 33 positions shown; findings below may reference images not displayed]

RADIATION DOSE REDUCTION: This exam was performed according to the
departmental dose-optimization program which includes automated
exposure control, adjustment of the mA and/or kV according to
patient size and/or use of iterative reconstruction technique.

CONTRAST:  75mL OMNIPAQUE IOHEXOL 350 MG/ML SOLN
FINDINGS: CT HEAD FINDINGS

Brain: No evidence of acute infarction, hemorrhage, cerebral edema,
mass, mass effect, or midline shift. No hydrocephalus or extra-axial
fluid collection. Lacunar infarct in the left basal ganglia is new
compared to the 09/28/2019 CT. Periventricular white matter changes,
likely the sequela of chronic small vessel ischemic disease. The
recently noted infarct in the right corona radiata is not
appreciated on this exam.

Vascular: No hyperdense vessel.

Skull: Normal. Negative for fracture or focal lesion.

Sinuses/Orbits: No acute finding.

Other: The mastoid air cells are well aerated.

CTA NECK FINDINGS

Aortic arch: Standard branching. Imaged portion shows no evidence of
aneurysm or dissection. No significant stenosis of the major arch
vessel origins.

Right carotid system: No evidence of dissection, occlusion, or
hemodynamically significant stenosis (greater than 50%).

Left carotid system: No evidence of dissection, occlusion, or
hemodynamically significant stenosis (greater than 50%).

Vertebral arteries: No evidence of dissection, occlusion, or
hemodynamically significant stenosis (greater than 50%).

Skeleton: No acute osseous abnormality.

Other neck: No acute finding.

Upper chest: No focal pulmonary opacity or pleural effusion.

Review of the MIP images confirms the above findings

CTA HEAD FINDINGS

Anterior circulation: Both internal carotid arteries are patent to
the termini, with mild calcification but without significant
stenosis.

A1 segments patent. Normal anterior communicating artery. Anterior
cerebral arteries are patent to their distal aspects.

No M1 stenosis or occlusion. Normal MCA bifurcations. Distal MCA
branches perfused and symmetric.

Posterior circulation: Vertebral arteries patent to the
vertebrobasilar junction without stenosis.

Basilar patent to its distal aspect. Superior cerebellar arteries
patent proximally.

Patent P1 segments. Redemonstrated mild left P1 and P2 stenosis, as
well as severe stenosis at the origin of the more medial P3 branch.
PCAs otherwise perfused to their distal aspects. The bilateral
posterior communicating arteries are not visualized.

Venous sinuses: As permitted by contrast timing, patent.

Anatomic variants: None significant.

Review of the MIP images confirms the above findings
IMPRESSION: 1. No intracranial large vessel occlusion. Overall unchanged
atherosclerotic disease in the left P1 and P2 and right P3 segments.
2.  No hemodynamically significant stenosis in the neck.
3.  No acute intracranial process.

## 2023-02-26 IMAGING — MR MR HEAD W/O CM
11 of 12 series · 43 of 48 positions shown · non-contrast
Comparison: 09/28/2019 MRI

CLINICAL DATA: Facial droop

EXAM:
MRI HEAD WITHOUT CONTRAST
TECHNIQUE: Multiplanar, multiecho pulse sequences of the brain and surrounding
structures were obtained without intravenous contrast.

[Series 5: DWI · axial · 3.0mm · 0.88mm/px · z∈[-113,+37]mm · 9 of 102 slices shown (1 of 4)]
[im 1/102]
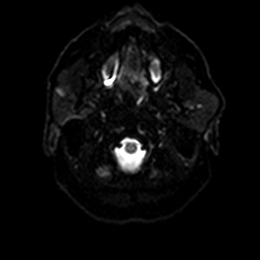
[im 13/102]
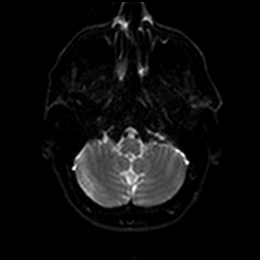
[im 26/102]
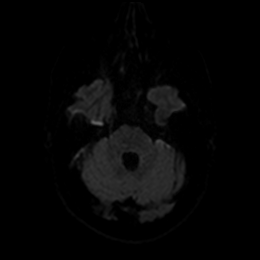
[im 38/102]
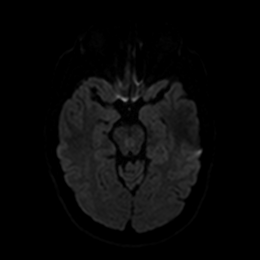
[im 51/102]
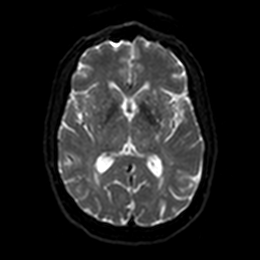
[im 64/102]
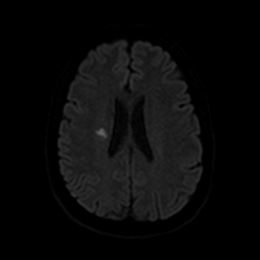
[im 76/102]
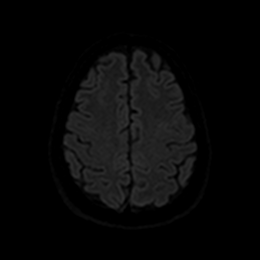
[im 89/102]
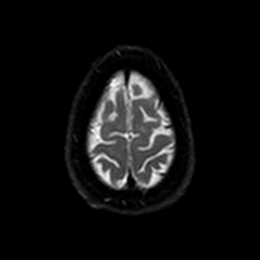
[im 102/102]
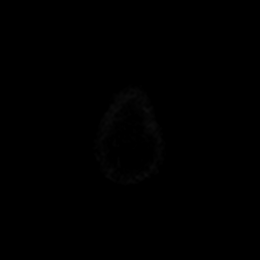

[Series 6: DWI · axial · 3.0mm · 0.88mm/px · z∈[-113,+37]mm · 4 of 51 slices shown (2 of 4)]
[im 1/51]
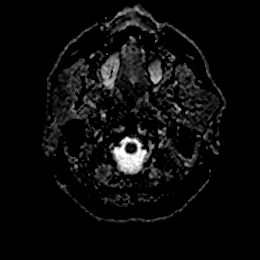
[im 17/51]
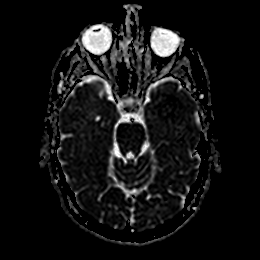
[im 34/51]
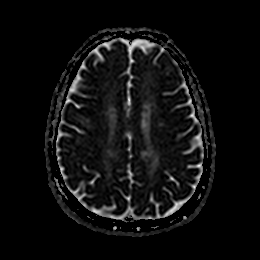
[im 51/51]
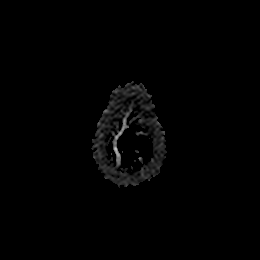

[Series 7: DWI · coronal · 4.0mm · 0.88mm/px · 5 of 64 slices shown (3 of 4)]
[im 1/64]
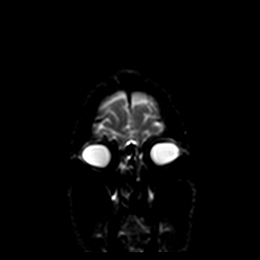
[im 16/64]
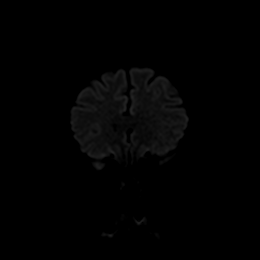
[im 32/64]
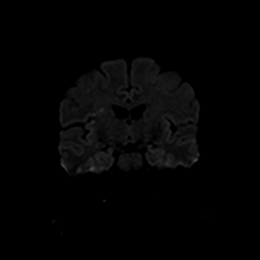
[im 48/64]
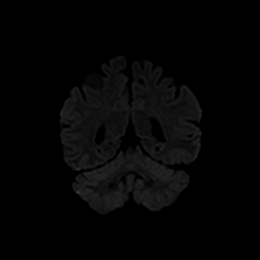
[im 64/64]
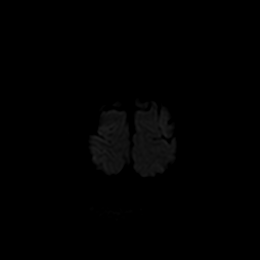

[Series 8: DWI · coronal · 4.0mm · 0.88mm/px · 3 of 32 slices shown (4 of 4)]
[im 1/32]
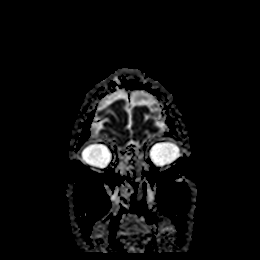
[im 16/32]
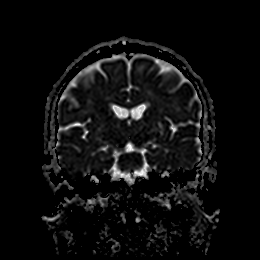
[im 32/32]
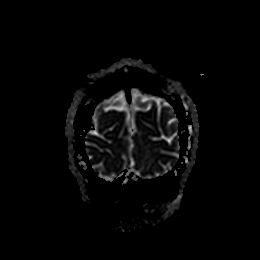

[Series 9: T1 · sagittal · 5.0mm · 0.75mm/px · 2 of 23 slices shown]
[im 1/23]
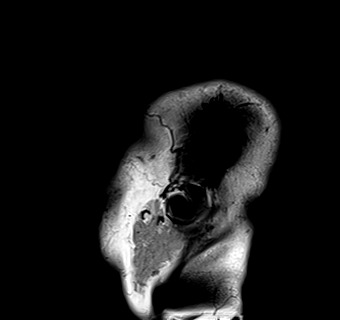
[im 23/23]
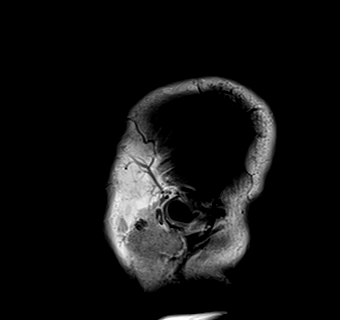

[Series 10: T2 · axial · 5.0mm · 0.72mm/px · z∈[-116,+40]mm · 2 of 27 slices shown (1 of 2)]
[im 1/27]
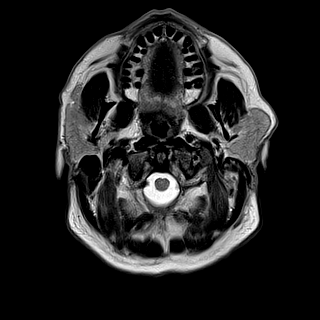
[im 27/27]
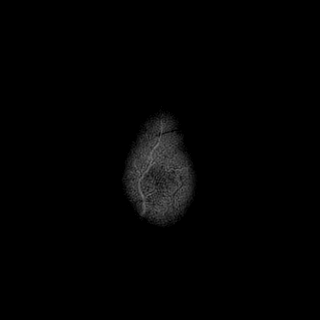

[Series 16: FLAIR · axial · 5.0mm · 0.45mm/px · z∈[-85,+70]mm · 2 of 27 slices shown]
[im 1/27]
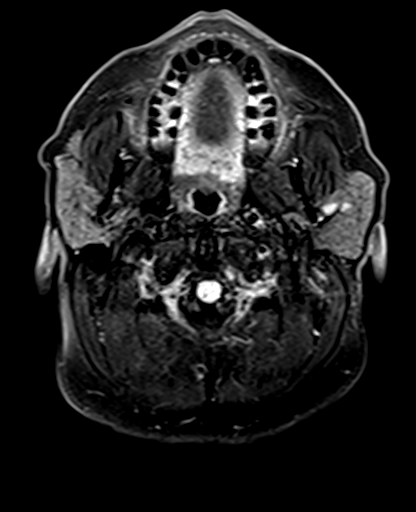
[im 27/27]
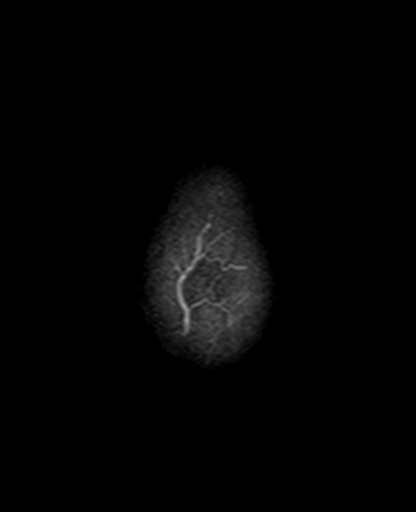

[Series 18: pha_images · axial · 3.0mm · 0.90mm/px · z∈[-96,+80]mm · 5 of 57 slices shown]
[im 1/57]
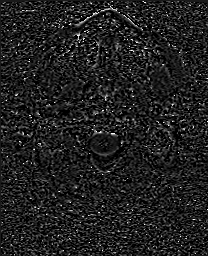
[im 15/57]
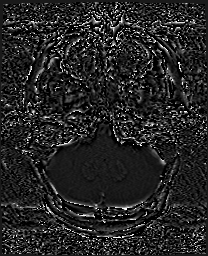
[im 29/57]
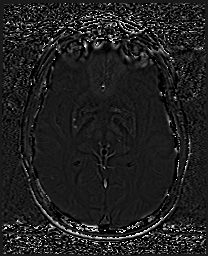
[im 43/57]
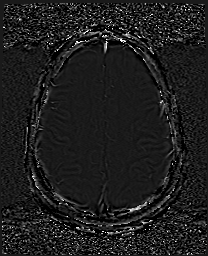
[im 57/57]
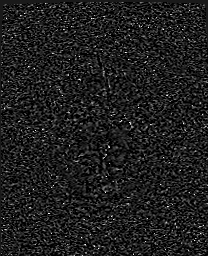

[Series 19: swi_images_nonorm · axial · 3.0mm · 0.90mm/px · z∈[-96,+80]mm · 5 of 60 slices shown]
[im 1/60]
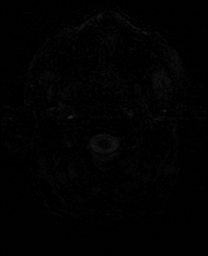
[im 15/60]
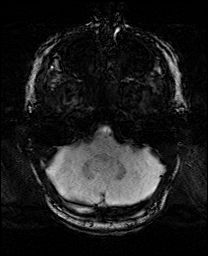
[im 30/60]
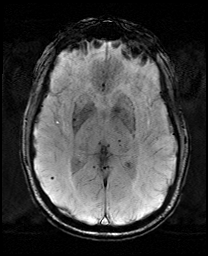
[im 45/60]
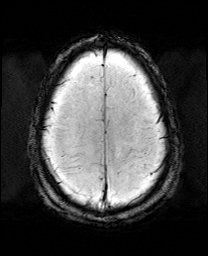
[im 60/60]
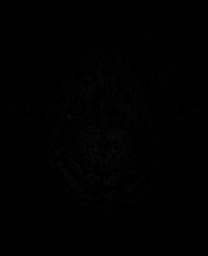

[Series 21: mip_images(sw) · axial · 24.0mm · 0.90mm/px · z∈[-85,+70]mm · 4 of 53 slices shown]
[im 1/53]
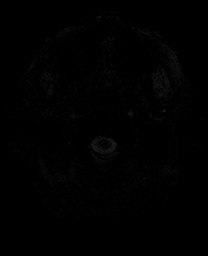
[im 18/53]
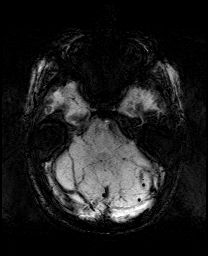
[im 35/53]
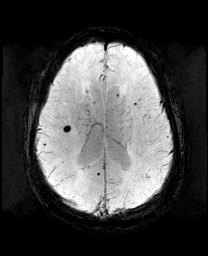
[im 53/53]
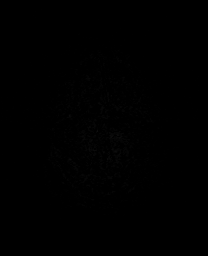

[Series 23: T2 · coronal · 5.0mm · 0.34mm/px · 2 of 29 slices shown (2 of 2)]
[im 1/29]
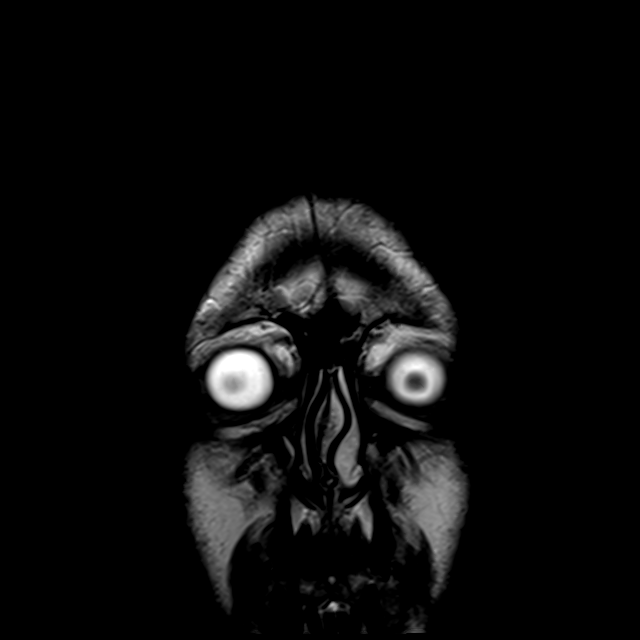
[im 29/29]
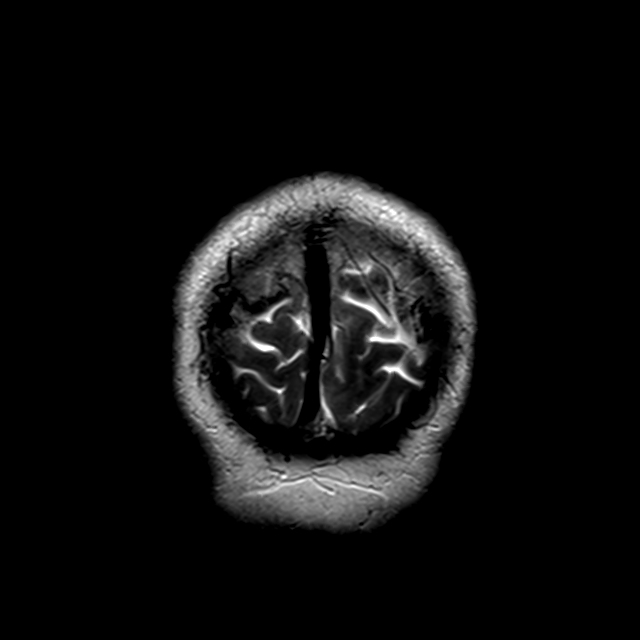

[43 of 48 positions shown; findings below may reference images not displayed]

FINDINGS: Brain: Small area of restricted diffusion with ADC correlate in the
right corona radiata (series 5, images 81-84), which appears to be
in a similar distribution to the 09/28/2019 infarct but is slightly
larger.

No acute hemorrhage, mass, mass effect, or midline shift. No
hydrocephalus or extra-axial collection.

Foci of hemosiderin deposition in the bilateral cerebral
hemispheres, most prominently in the left occipital lobe, as well as
in the bilateral basal ganglia and thalami. Lacunar infarcts in the
bilateral basal ganglia, corona radiata, and left pons. T2
hyperintense signal in the periventricular white matter, likely the
sequela of moderate chronic small vessel ischemic disease.

Vascular: Normal flow voids.

Skull and upper cervical spine: Normal marrow signal.

Sinuses/Orbits: Mild mucosal thickening in the maxillary sinuses,
left sphenoid sinus, and ethmoid air cells. The orbits are
unremarkable.

Other: The mastoids are well aerated.
IMPRESSION: 1. Small area of acute infarction in the right corona radiata, which
appears to be in a similar area to the 09/28/2019 infarct.
2. Numerous foci of hemosiderin deposition in the bilateral cerebral
hemispheres most prominently in the left occipital lobe, as well as
in the bilateral basal ganglia and thalami. While some of these may
reflect the sequela of prior hypertensive microhemorrhages, cerebral
amyloid angiopathy could appear similar, particularly with regards
to the cerebral microhemorrhages.

These results were called by telephone at the time of interpretation
on 12/01/2021 at [DATE] to provider BILIC , who verbally
acknowledged these results.

## 2023-03-19 ENCOUNTER — Ambulatory Visit (HOSPITAL_COMMUNITY): Payer: Managed Care, Other (non HMO) | Attending: Cardiology

## 2023-03-19 DIAGNOSIS — I340 Nonrheumatic mitral (valve) insufficiency: Secondary | ICD-10-CM

## 2023-03-19 DIAGNOSIS — I77810 Thoracic aortic ectasia: Secondary | ICD-10-CM

## 2023-03-19 LAB — ECHOCARDIOGRAM COMPLETE
Area-P 1/2: 3.91 cm2
S' Lateral: 2.8 cm

## 2023-03-22 ENCOUNTER — Other Ambulatory Visit: Payer: Self-pay | Admitting: *Deleted

## 2023-03-22 DIAGNOSIS — I77810 Thoracic aortic ectasia: Secondary | ICD-10-CM

## 2023-08-26 ENCOUNTER — Encounter: Payer: Self-pay | Admitting: Internal Medicine

## 2023-08-26 ENCOUNTER — Ambulatory Visit (INDEPENDENT_AMBULATORY_CARE_PROVIDER_SITE_OTHER): Payer: Managed Care, Other (non HMO) | Admitting: Internal Medicine

## 2023-08-26 VITALS — BP 120/88 | HR 90 | Temp 97.7°F | Ht 71.0 in | Wt 241.2 lb

## 2023-08-26 DIAGNOSIS — Z23 Encounter for immunization: Secondary | ICD-10-CM

## 2023-08-26 DIAGNOSIS — E785 Hyperlipidemia, unspecified: Secondary | ICD-10-CM | POA: Diagnosis not present

## 2023-08-26 DIAGNOSIS — E559 Vitamin D deficiency, unspecified: Secondary | ICD-10-CM | POA: Diagnosis not present

## 2023-08-26 DIAGNOSIS — Z0000 Encounter for general adult medical examination without abnormal findings: Secondary | ICD-10-CM

## 2023-08-26 DIAGNOSIS — E66811 Obesity, class 1: Secondary | ICD-10-CM | POA: Diagnosis not present

## 2023-08-26 DIAGNOSIS — I10 Essential (primary) hypertension: Secondary | ICD-10-CM

## 2023-08-26 DIAGNOSIS — I639 Cerebral infarction, unspecified: Secondary | ICD-10-CM

## 2023-08-26 DIAGNOSIS — Z8673 Personal history of transient ischemic attack (TIA), and cerebral infarction without residual deficits: Secondary | ICD-10-CM

## 2023-08-26 LAB — COMPREHENSIVE METABOLIC PANEL
ALT: 51 U/L (ref 0–53)
AST: 26 U/L (ref 0–37)
Albumin: 5 g/dL (ref 3.5–5.2)
Alkaline Phosphatase: 73 U/L (ref 39–117)
BUN: 14 mg/dL (ref 6–23)
CO2: 26 meq/L (ref 19–32)
Calcium: 9.5 mg/dL (ref 8.4–10.5)
Chloride: 103 meq/L (ref 96–112)
Creatinine, Ser: 0.72 mg/dL (ref 0.40–1.50)
GFR: 100.03 mL/min (ref >60.00)
Glucose, Bld: 106 mg/dL — ABNORMAL HIGH (ref 70–99)
Potassium: 4.2 meq/L (ref 3.5–5.1)
Sodium: 137 meq/L (ref 135–145)
Total Bilirubin: 0.9 mg/dL (ref 0.2–1.2)
Total Protein: 7.2 g/dL (ref 6.0–8.3)

## 2023-08-26 LAB — VITAMIN B12: Vitamin B-12: 301 pg/mL (ref 211–911)

## 2023-08-26 LAB — LIPID PANEL
Cholesterol: 112 mg/dL (ref 0–200)
HDL: 36.9 mg/dL — ABNORMAL LOW (ref >39.00)
LDL Cholesterol: 47 mg/dL (ref 0–99)
NonHDL: 75.3
Total CHOL/HDL Ratio: 3
Triglycerides: 141 mg/dL (ref 0.0–149.0)
VLDL: 28.2 mg/dL (ref 0.0–40.0)

## 2023-08-26 LAB — CBC WITH DIFFERENTIAL/PLATELET
Basophils Absolute: 0.1 10*3/uL (ref 0.0–0.1)
Basophils Relative: 0.7 % (ref 0.0–3.0)
Eosinophils Absolute: 0.3 10*3/uL (ref 0.0–0.7)
Eosinophils Relative: 4 % (ref 0.0–5.0)
HCT: 45.9 % (ref 39.0–52.0)
Hemoglobin: 15.9 g/dL (ref 13.0–17.0)
Lymphocytes Relative: 26.8 % (ref 12.0–46.0)
Lymphs Abs: 2 10*3/uL (ref 0.7–4.0)
MCHC: 34.6 g/dL (ref 30.0–36.0)
MCV: 88.8 fl (ref 78.0–100.0)
Monocytes Absolute: 0.5 10*3/uL (ref 0.1–1.0)
Monocytes Relative: 7 % (ref 3.0–12.0)
Neutro Abs: 4.5 10*3/uL (ref 1.4–7.7)
Neutrophils Relative %: 61.5 % (ref 43.0–77.0)
Platelets: 243 10*3/uL (ref 150.0–400.0)
RBC: 5.17 Mil/uL (ref 4.22–5.81)
RDW: 13.7 % (ref 11.5–15.5)
WBC: 7.4 10*3/uL (ref 4.0–10.5)

## 2023-08-26 LAB — VITAMIN D 25 HYDROXY (VIT D DEFICIENCY, FRACTURES): VITD: 17.26 ng/mL — ABNORMAL LOW (ref 30.00–100.00)

## 2023-08-26 LAB — PSA: PSA: 1.74 ng/mL (ref 0.10–4.00)

## 2023-08-26 LAB — TSH: TSH: 0.97 u[IU]/mL (ref 0.35–5.50)

## 2023-08-26 MED ORDER — CLOPIDOGREL BISULFATE 75 MG PO TABS
75.0000 mg | ORAL_TABLET | Freq: Every day | ORAL | 3 refills | Status: AC
Start: 2023-08-26 — End: ?

## 2023-08-26 MED ORDER — AMLODIPINE BESYLATE 5 MG PO TABS
5.0000 mg | ORAL_TABLET | Freq: Every day | ORAL | 3 refills | Status: DC
Start: 2023-08-26 — End: 2024-04-24

## 2023-08-26 MED ORDER — ATORVASTATIN CALCIUM 40 MG PO TABS: 40.0000 mg | ORAL_TABLET | Freq: Every day | ORAL | 3 refills | Status: AC

## 2023-08-26 NOTE — Progress Notes (Signed)
 Established Patient Office Visit     CC/Reason for Visit: Annual preventive exam  HPI: Dylan Fuller is a 58 y.o. male who is coming in today for the above mentioned reasons. Past Medical History is significant for: Hypertension, hyperlipidemia, history of CVA, vitamin D deficiency.  Feeling well without concerns or complaints.  Has routine eye and dental care.  Is due for repeat colonoscopy this year.   Past Medical/Surgical History: Past Medical History:  Diagnosis Date   Constipation    CVA (cerebral vascular accident) (HCC)    09/2019   Hyperlipidemia    Hypertension    Obesity (BMI 30.0-34.9)    Prostatitis, chronic     Past Surgical History:  Procedure Laterality Date   HERNIA REPAIR     age 37 - ?ingunial x2    Social History:  reports that he has never smoked. He has never used smokeless tobacco. He reports current alcohol use of about 4.0 standard drinks of alcohol per week. He reports that he does not use drugs.  Allergies: No Known Allergies  Family History:  Family History  Problem Relation Age of Onset   Arthritis Mother    Heart disease Mother    Hyperlipidemia Mother    Hypertension Mother    Dementia Mother    Heart disease Father        52; post-rheumatic fever valve disease   Colon cancer Neg Hx    Colon polyps Neg Hx    Esophageal cancer Neg Hx    Rectal cancer Neg Hx    Stomach cancer Neg Hx    Stroke Neg Hx    Sleep apnea Neg Hx      Current Outpatient Medications:    Acetaminophen (TYLENOL PO), Take by mouth as needed., Disp: , Rfl:    amLODipine (NORVASC) 5 MG tablet, Take 1 tablet (5 mg total) by mouth daily., Disp: 90 tablet, Rfl: 3   atorvastatin (LIPITOR) 40 MG tablet, Take 1 tablet (40 mg total) by mouth daily., Disp: 90 tablet, Rfl: 3   clopidogrel (PLAVIX) 75 MG tablet, Take 1 tablet (75 mg total) by mouth daily., Disp: 90 tablet, Rfl: 3  Review of Systems:  Negative unless indicated in HPI.   Physical  Exam: Vitals:   08/26/23 0950  BP: 120/88  Pulse: 90  Temp: 97.7 F (36.5 C)  TempSrc: Oral  SpO2: 98%  Weight: 241 lb 3.2 oz (109.4 kg)  Height: 5\' 11"  (1.803 m)    Body mass index is 33.64 kg/m.   Physical Exam Vitals reviewed.  Constitutional:      General: He is not in acute distress.    Appearance: Normal appearance. He is not ill-appearing, toxic-appearing or diaphoretic.  HENT:     Head: Normocephalic.     Right Ear: Tympanic membrane, ear canal and external ear normal. There is no impacted cerumen.     Left Ear: Tympanic membrane, ear canal and external ear normal. There is no impacted cerumen.     Nose: Nose normal.     Mouth/Throat:     Mouth: Mucous membranes are moist.     Pharynx: Oropharynx is clear. No oropharyngeal exudate or posterior oropharyngeal erythema.  Eyes:     General: No scleral icterus.       Right eye: No discharge.        Left eye: No discharge.     Conjunctiva/sclera: Conjunctivae normal.     Pupils: Pupils are equal, round, and reactive to  light.  Neck:     Vascular: No carotid bruit.  Cardiovascular:     Rate and Rhythm: Normal rate and regular rhythm.     Pulses: Normal pulses.     Heart sounds: Normal heart sounds.  Pulmonary:     Effort: Pulmonary effort is normal. No respiratory distress.     Breath sounds: Normal breath sounds.  Abdominal:     General: Abdomen is flat. Bowel sounds are normal.     Palpations: Abdomen is soft.  Musculoskeletal:        General: Normal range of motion.     Cervical back: Normal range of motion.  Skin:    General: Skin is warm and dry.  Neurological:     General: No focal deficit present.     Mental Status: He is alert and oriented to person, place, and time. Mental status is at baseline.  Psychiatric:        Mood and Affect: Mood normal.        Behavior: Behavior normal.        Thought Content: Thought content normal.        Judgment: Judgment normal.         Impression and  Plan:  Encounter for preventive health examination -     PSA; Future  Vitamin D deficiency -     VITAMIN D 25 Hydroxy (Vit-D Deficiency, Fractures); Future  Essential hypertension -     CBC with Differential/Platelet; Future -     Comprehensive metabolic panel; Future -     amLODIPine Besylate; Take 1 tablet (5 mg total) by mouth daily.  Dispense: 90 tablet; Refill: 3  Hyperlipidemia, unspecified hyperlipidemia type -     Lipid panel; Future -     Atorvastatin Calcium; Take 1 tablet (40 mg total) by mouth daily.  Dispense: 90 tablet; Refill: 3  Obesity (BMI 30.0-34.9) -     TSH; Future -     Vitamin B12; Future  H/O: CVA (cerebrovascular accident)  Immunization due  Cerebrovascular accident (CVA), unspecified mechanism (HCC) -     Clopidogrel Bisulfate; Take 1 tablet (75 mg total) by mouth daily.  Dispense: 90 tablet; Refill: 3   -Recommend routine eye and dental care. -Healthy lifestyle discussed in detail. -Labs to be updated today. -Prostate cancer screening: PSA today Health Maintenance  Topic Date Due   Pneumococcal Vaccination (1 of 2 - PCV) Never done   Colon Cancer Screening  12/11/2021   COVID-19 Vaccine (5 - 2024-25 season) 02/14/2023   DTaP/Tdap/Td vaccine (2 - Td or Tdap) 06/28/2030   Flu Shot  Completed   Hepatitis C Screening  Completed   HIV Screening  Completed   Zoster (Shingles) Vaccine  Completed   HPV Vaccine  Aged Out     -Prevnar 20 in office today. -Advised to follow-up with GI this year.    Chaya Jan, MD [facility]

## 2023-08-26 NOTE — Addendum Note (Signed)
 Addended by: Kern Reap B on: 08/26/2023 11:24 AM   Modules accepted: Orders

## 2023-08-30 ENCOUNTER — Other Ambulatory Visit: Payer: Self-pay | Admitting: Internal Medicine

## 2023-08-30 ENCOUNTER — Encounter: Payer: Self-pay | Admitting: Internal Medicine

## 2023-08-30 DIAGNOSIS — E559 Vitamin D deficiency, unspecified: Secondary | ICD-10-CM

## 2023-08-30 MED ORDER — VITAMIN D (ERGOCALCIFEROL) 1.25 MG (50000 UNIT) PO CAPS: 50000.0000 [IU] | ORAL_CAPSULE | ORAL | 0 refills | Status: AC

## 2023-11-19 ENCOUNTER — Other Ambulatory Visit (INDEPENDENT_AMBULATORY_CARE_PROVIDER_SITE_OTHER)

## 2023-11-19 DIAGNOSIS — E559 Vitamin D deficiency, unspecified: Secondary | ICD-10-CM | POA: Diagnosis not present

## 2023-11-22 LAB — VITAMIN D 25 HYDROXY (VIT D DEFICIENCY, FRACTURES): VITD: 26.25 ng/mL — ABNORMAL LOW (ref 30.00–100.00)

## 2023-12-06 ENCOUNTER — Ambulatory Visit: Payer: Self-pay | Admitting: Internal Medicine

## 2023-12-06 DIAGNOSIS — E559 Vitamin D deficiency, unspecified: Secondary | ICD-10-CM

## 2023-12-06 MED ORDER — VITAMIN D (ERGOCALCIFEROL) 1.25 MG (50000 UNIT) PO CAPS: 50000.0000 [IU] | ORAL_CAPSULE | ORAL | 0 refills | Status: AC

## 2024-02-18 ENCOUNTER — Encounter: Payer: Self-pay | Admitting: Physician Assistant

## 2024-02-25 ENCOUNTER — Other Ambulatory Visit (INDEPENDENT_AMBULATORY_CARE_PROVIDER_SITE_OTHER)

## 2024-02-25 DIAGNOSIS — E559 Vitamin D deficiency, unspecified: Secondary | ICD-10-CM | POA: Diagnosis not present

## 2024-02-25 LAB — VITAMIN D 25 HYDROXY (VIT D DEFICIENCY, FRACTURES): VITD: 36.22 ng/mL (ref 30.00–100.00)

## 2024-02-26 ENCOUNTER — Encounter: Payer: Self-pay | Admitting: Internal Medicine

## 2024-02-27 ENCOUNTER — Ambulatory Visit: Payer: Self-pay | Admitting: Internal Medicine

## 2024-02-28 MED ORDER — VITAMIN D (ERGOCALCIFEROL) 1.25 MG (50000 UNIT) PO CAPS: 50000.0000 [IU] | ORAL_CAPSULE | ORAL | 0 refills | Status: AC

## 2024-03-31 ENCOUNTER — Ambulatory Visit (HOSPITAL_COMMUNITY)
Admission: RE | Admit: 2024-03-31 | Discharge: 2024-03-31 | Disposition: A | Discharge status: Home / Self Care | Source: Ambulatory Visit | Attending: Cardiology | Admitting: Cardiology

## 2024-03-31 DIAGNOSIS — I77810 Thoracic aortic ectasia: Secondary | ICD-10-CM

## 2024-03-31 LAB — ECHOCARDIOGRAM COMPLETE
Area-P 1/2: 4.52 cm2
MV M vel: 4.31 m/s
MV Peak grad: 74.1 mmHg
S' Lateral: 3.4 cm

## 2024-04-01 ENCOUNTER — Ambulatory Visit: Payer: Self-pay | Admitting: Cardiology

## 2024-04-14 ENCOUNTER — Ambulatory Visit: Admitting: Physician Assistant

## 2024-04-20 ENCOUNTER — Ambulatory Visit: Attending: Cardiology | Admitting: Cardiology

## 2024-04-20 ENCOUNTER — Encounter: Payer: Self-pay | Admitting: Cardiology

## 2024-04-20 VITALS — BP 136/80 | HR 88 | Ht 71.0 in | Wt 237.0 lb

## 2024-04-20 DIAGNOSIS — I10 Essential (primary) hypertension: Secondary | ICD-10-CM | POA: Diagnosis not present

## 2024-04-20 DIAGNOSIS — I77810 Thoracic aortic ectasia: Secondary | ICD-10-CM

## 2024-04-20 DIAGNOSIS — E785 Hyperlipidemia, unspecified: Secondary | ICD-10-CM | POA: Diagnosis not present

## 2024-04-20 DIAGNOSIS — I340 Nonrheumatic mitral (valve) insufficiency: Secondary | ICD-10-CM | POA: Diagnosis not present

## 2024-04-20 DIAGNOSIS — Z01812 Encounter for preprocedural laboratory examination: Secondary | ICD-10-CM

## 2024-04-20 LAB — CBC

## 2024-04-20 MED ORDER — METOPROLOL TARTRATE 100 MG PO TABS: 100.0000 mg | ORAL_TABLET | ORAL | 0 refills | Status: AC

## 2024-04-20 NOTE — Patient Instructions (Addendum)
 Medication Instructions:  The current medical regimen is effective;  continue present plan and medications.  *If you need a refill on your cardiac medications before your next appointment, please call your pharmacy*  Lab Work: Please have blood work  today (CBC, BMP)  If you have labs (blood work) drawn today and your tests are completely normal, you will receive your results only by: MyChart Message (if you have MyChart) OR A paper copy in the mail If you have any lab test that is abnormal or we need to change your treatment, we will call you to review the results.  Testing/Procedures:   Your cardiac CT will be scheduled at:   [facility] [address]  [place], [place] [address]  Please enter the parking lot using the [address] entrance and use the FREE valet service at the patient drop-off area. Enter the building and check-in with registration on the main floor.  Please follow these instructions carefully (unless otherwise directed):  An IV will be required for this test and Nitroglycerin will be given.  Hold all erectile dysfunction medications at least 3 days (72 hrs) prior to test. (Ie viagra, cialis, sildenafil, tadalafil, etc)   On the Night Before the Test: Be sure to Drink plenty of water. Do not consume any caffeinated/decaffeinated beverages or chocolate 12 hours prior to your test. Do not take any antihistamines 12 hours prior to your test.  On the Day of the Test: Drink plenty of water until 1 hour prior to the test. Do not eat any food 1 hour prior to test. You may take your regular medications prior to the test. Hold Amlodipine  this AM. Take metoprolol (Lopressor) two hours prior to test. If you take Furosemide/Hydrochlorothiazide/Spironolactone/Chlorthalidone, please HOLD on the morning of the test. Patients who wear a continuous glucose monitor MUST remove the device prior to scanning.      After the Test: Drink plenty  of water. After receiving IV contrast, you may experience a mild flushed feeling. This is normal. On occasion, you may experience a mild rash up to 24 hours after the test. This is not dangerous. If this occurs, you can take Benadryl 25 mg, Zyrtec, Claritin, or Allegra and increase your fluid intake. (Patients taking Tikosyn should avoid Benadryl, and may take Zyrtec, Claritin, or Allegra) If you experience trouble breathing, this can be serious. If it is severe call 911 IMMEDIATELY. If it is mild, please call our office.  We will call to schedule your test 2-4 weeks out understanding that some insurance companies will need an authorization prior to the service being performed.   For more information and frequently asked questions, please visit our website : [email or web address]  For non-scheduling related questions, please contact the cardiac imaging nurse navigator should you have any questions/concerns: Cardiac Imaging Nurse Navigators Direct Office Dial: [phone]   For scheduling needs, including cancellations and rescheduling, please call Brittany, [phone].    You are scheduled for a TEE (Transesophageal Echocardiogram) on Thursday, November 20 with Dr. Francyne.  Please arrive at the [facility] (Main Entrance A) at [facility]: [address] [place], [place] [address] at 9:00 AM (This time is 1 hour(s) before your procedure to ensure your preparation).   Free valet parking service is available. You will check in at ADMITTING.   *Please Note: You will receive a call the day before your procedure to confirm the appointment time. That time may have changed from the original time based  on the schedule for that day.*    DIET:  Nothing to eat or drink after midnight except a sip of water with medications (see medication instructions below)  MEDICATION INSTRUCTIONS: !!IF ANY NEW MEDICATIONS ARE STARTED AFTER TODAY, PLEASE NOTIFY YOUR PROVIDER AS SOON AS  POSSIBLE!!  FYI: Medications such as Semaglutide (Ozempic, Wegovy), Tirzepatide (Mounjaro, Zepbound), Dulaglutide (Trulicity), etc (GLP1 agonists) AND Canagliflozin (Invokana), Dapagliflozin (Farxiga), Empagliflozin (Jardiance), Ertugliflozin (Steglatro), Bexagliflozin [employer]) or any combination with one of these drugs such as Invokamet (Canagliflozin/Metformin), Synjardy (Empagliflozin/Metformin), etc (SGLT2 inhibitors) must be held around the time of a procedure. This is not a comprehensive list of all of these drugs. Please review all of your medications and talk to your provider if you take any one of these. If you are not sure, ask your provider.   LABS: Come to the lab at the [facility] ([address] [place], 1st Floor) today 04/20/24 between the hours of 8:00 am and 4:30 pm. You do NOT have to be fasting.   Your support person will be asked to wait in the waiting room during your procedure.  It is OK to have someone drop you off and come back when you are ready to be discharged.  You cannot drive after the procedure and will need someone to drive you home.  Bring your insurance cards.  *Special Note: Every effort is made to have your procedure done on time. Occasionally there are emergencies that occur at the hospital that may cause delays. Please be patient if a delay does occur.      Follow-Up: At [facility], you and your health needs are our priority.  As part of our continuing mission to provide you with exceptional heart care, our providers are all part of one team.  This team includes your primary Cardiologist (physician) and Advanced Practice Providers or APPs (Physician Assistants and Nurse Practitioners) who all work together to provide you with the care you need, when you need it.  Your next appointment:   Follow up will be based on the results of the above testing.   We recommend signing up for the patient  portal called MyChart.  Sign up information is provided on this After Visit Summary.  MyChart is used to connect with patients for Virtual Visits (Telemedicine).  Patients are able to view lab/test results, encounter notes, upcoming appointments, etc.  Non-urgent messages can be sent to your provider as well.   To learn more about what you can do with MyChart, go to [email or web address].

## 2024-04-20 NOTE — Progress Notes (Signed)
 Cardiology Office Note:  .   Date:  04/20/2024  ID:  Dylan Fuller, DOB Sep 30, 1965, MRN [id number] PCP: Theophilus Andrews, Tully GRADE, MD  [facility] Providers Cardiologist:  None    History of Present Illness: Dylan   MAURICIO Fuller is a 58 y.o. 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  He has a history of mitral valve prolapse, previously identified on echocardiogram. A recent echocardiogram showed significant mitral valve leakage. 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 No shortness of breath or exercise intolerance.  He has a history of lacunar strokes in 2021 and 2023, with symptoms of nausea, balance issues, and transient facial droop, all resolving without residual effects. He is on Plavix  and atorvastatin  40 mg for stroke prevention and cholesterol management, and amlodipine  5 mg for blood pressure control.  No history of diabetes or coronary artery disease. His LDL is 47 mg/dL, J8r is 4.4%, and creatinine is 0.7 mg/dL, indicating good control of cardiovascular risk factors.  Family history includes his father undergoing open-heart surgery for a similar condition 43 years ago. He is concerned about the potential need for surgery, recalling his father's challenging recovery.      ROS: No chest pain no shortness of breath no syncope no bleeding no stroke residual symptoms  Studies Reviewed: Dylan   EKG Interpretation Date/Time:  Thursday April 20 2024 08:10:19 EST Ventricular Rate:  88 PR Interval:  174 QRS Duration:  86 QT Interval:  392 QTC Calculation: 474 R Axis:   18  Text Interpretation: Normal sinus rhythm When compared with ECG of 30-Nov-2021 17:11, No significant change since last tracing Confirmed by Jeffrie Dylan ([id number]) on 04/20/2024 8:31:22 AM     Results LABS LDL: 47 A1c: 5.5 Creatinine: 0.7  RADIOLOGY MRI: Lacunar infarcts in cerebellum (2023)  DIAGNOSTIC Echocardiogram: Mitral valve prolapse with significant regurgitation EKG: Normal Risk Assessment/Calculations:            Physical Exam:   VS:  BP 136/80   Pulse 88   Ht 5' 11 (1.803 m)   Wt 237 lb (107.5 kg)   SpO2 92%   BMI 33.05 kg/m    Wt Readings from Last 3 Encounters:  04/20/24 237 lb (107.5 kg)  08/26/23 241 lb 3.2 oz (109.4 kg)  12/24/22 239 lb 1.6 oz (108.5 kg)    GEN: Well nourished, well developed in no acute distress NECK: No JVD; No carotid bruits CARDIAC: RRR, soft systolic murmur, no rubs, no gallops RESPIRATORY:  Clear to auscultation without rales, wheezing or rhonchi  ABDOMEN: Soft, non-tender, non-distended EXTREMITIES:  No edema; No deformity   ASSESSMENT AND PLAN: .    Assessment and Plan Assessment & Plan Severe mitral regurgitation due to mitral valve prolapse Severe mitral regurgitation secondary to mitral valve prolapse with significant backward blood flow. Asymptomatic with occasional palpitations. Potential for heart failure if untreated. Mitral valve repair preferred over replacement. TEE planned for detailed assessment. Potential referral to [facility] for mitral valve repair if severe. Mini thoracotomy may be considered to avoid full open heart surgery. - Will schedule transesophageal echocardiogram (TEE) to assess mitral valve and aorta. - Will consider referral to [facility] for mitral valve repair if severe regurgitation is confirmed. - Will discuss potential for mini thoracotomy versus full open heart surgery based on TEE  findings. - Will check coronary CT to understand coronary anatomy preoperatively.  This will also help with aortic measurement.  Aortic root dilation (monitoring) Aortic root dilation requiring monitoring. Potential need for repair if significantly enlarged, especially if combined with mitral valve repair. TEE  will assess aortic root size and condition. - Will evaluate aortic root size and condition during TEE. - Will consider surgical intervention if aortic root is significantly enlarged (open procedure).  History of lacunar strokes without residual deficits Lacunar strokes in 2021 and 2023 with no residual deficits. Managed with Plavix  and atorvastatin  for secondary prevention. - Continue Plavix  and atorvastatin  for stroke prevention. - Has seen Dr. Rosemarie with neuro. Notes reviewed  Essential hypertension Blood pressure well-controlled with amlodipine  5 mg daily. - Continue amlodipine  5 mg daily for blood pressure management.  Hyperlipidemia LDL cholesterol well-controlled at 47 mg/dL with atorvastatin  40 mg daily. - Continue atorvastatin  40 mg daily for lipid management.         Dispo: following studies  Signed, Dylan Parchment, MD

## 2024-04-20 NOTE — H&P (View-Only) (Signed)
 Cardiology Office Note:  .   Date:  04/20/2024  ID:  Dylan Fuller High, DOB Sep 30, 1965, MRN [id number] PCP: Theophilus Andrews, Tully GRADE, MD  [facility] Providers Cardiologist:  None    History of Present Illness: Dylan Fuller   Dylan Fuller is a 58 y.o. 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  He has a history of mitral valve prolapse, previously identified on echocardiogram. A recent echocardiogram showed significant mitral valve leakage. 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 No shortness of breath or exercise intolerance.  He has a history of lacunar strokes in 2021 and 2023, with symptoms of nausea, balance issues, and transient facial droop, all resolving without residual effects. He is on Plavix  and atorvastatin  40 mg for stroke prevention and cholesterol management, and amlodipine  5 mg for blood pressure control.  No history of diabetes or coronary artery disease. His LDL is 47 mg/dL, J8r is 4.4%, and creatinine is 0.7 mg/dL, indicating good control of cardiovascular risk factors.  Family history includes his father undergoing open-heart surgery for a similar condition 43 years ago. He is concerned about the potential need for surgery, recalling his father's challenging recovery.      ROS: No chest pain no shortness of breath no syncope no bleeding no stroke residual symptoms  Studies Reviewed: Dylan Fuller   EKG Interpretation Date/Time:  Thursday April 20 2024 08:10:19 EST Ventricular Rate:  88 PR Interval:  174 QRS Duration:  86 QT Interval:  392 QTC Calculation: 474 R Axis:   18  Text Interpretation: Normal sinus rhythm When compared with ECG of 30-Nov-2021 17:11, No significant change since last tracing Confirmed by Dylan Fuller ([id number]) on 04/20/2024 8:31:22 AM     Results LABS LDL: 47 A1c: 5.5 Creatinine: 0.7  RADIOLOGY MRI: Lacunar infarcts in cerebellum (2023)  DIAGNOSTIC Echocardiogram: Mitral valve prolapse with significant regurgitation EKG: Normal Risk Assessment/Calculations:            Physical Exam:   VS:  BP 136/80   Pulse 88   Ht 5' 11 (1.803 m)   Wt 237 lb (107.5 kg)   SpO2 92%   BMI 33.05 kg/m    Wt Readings from Last 3 Encounters:  04/20/24 237 lb (107.5 kg)  08/26/23 241 lb 3.2 oz (109.4 kg)  12/24/22 239 lb 1.6 oz (108.5 kg)    GEN: Well nourished, well developed in no acute distress NECK: No JVD; No carotid bruits CARDIAC: RRR, soft systolic murmur, no rubs, no gallops RESPIRATORY:  Clear to auscultation without rales, wheezing or rhonchi  ABDOMEN: Soft, non-tender, non-distended EXTREMITIES:  No edema; No deformity   ASSESSMENT AND PLAN: .    Assessment and Plan Assessment & Plan Severe mitral regurgitation due to mitral valve prolapse Severe mitral regurgitation secondary to mitral valve prolapse with significant backward blood flow. Asymptomatic with occasional palpitations. Potential for heart failure if untreated. Mitral valve repair preferred over replacement. TEE planned for detailed assessment. Potential referral to [facility] for mitral valve repair if severe. Mini thoracotomy may be considered to avoid full open heart surgery. - Will schedule transesophageal echocardiogram (TEE) to assess mitral valve and aorta. - Will consider referral to [facility] for mitral valve repair if severe regurgitation is confirmed. - Will discuss potential for mini thoracotomy versus full open heart surgery based on TEE  findings. - Will check coronary CT to understand coronary anatomy preoperatively.  This will also help with aortic measurement.  Aortic root dilation (monitoring) Aortic root dilation requiring monitoring. Potential need for repair if significantly enlarged, especially if combined with mitral valve repair. TEE  will assess aortic root size and condition. - Will evaluate aortic root size and condition during TEE. - Will consider surgical intervention if aortic root is significantly enlarged (open procedure).  History of lacunar strokes without residual deficits Lacunar strokes in 2021 and 2023 with no residual deficits. Managed with Plavix  and atorvastatin  for secondary prevention. - Continue Plavix  and atorvastatin  for stroke prevention. - Has seen Dr. Rosemarie with neuro. Notes reviewed  Essential hypertension Blood pressure well-controlled with amlodipine  5 mg daily. - Continue amlodipine  5 mg daily for blood pressure management.  Hyperlipidemia LDL cholesterol well-controlled at 47 mg/dL with atorvastatin  40 mg daily. - Continue atorvastatin  40 mg daily for lipid management.         Dispo: following studies  Signed, Dylan Parchment, MD

## 2024-04-21 ENCOUNTER — Other Ambulatory Visit: Payer: Self-pay | Admitting: Internal Medicine

## 2024-04-21 ENCOUNTER — Ambulatory Visit: Payer: Self-pay | Admitting: Cardiology

## 2024-04-21 DIAGNOSIS — I10 Essential (primary) hypertension: Secondary | ICD-10-CM

## 2024-04-21 LAB — CBC
Hematocrit: 48.9 % (ref 37.5–51.0)
Hemoglobin: 16.5 g/dL (ref 13.0–17.7)
MCH: 30.7 pg (ref 26.6–33.0)
MCHC: 33.7 g/dL (ref 31.5–35.7)
MCV: 91 fL (ref 79–97)
Platelets: 254 x10E3/uL (ref 150–450)
RBC: 5.37 x10E6/uL (ref 4.14–5.80)
RDW: 12.7 % (ref 11.6–15.4)
WBC: 10.1 x10E3/uL (ref 3.4–10.8)

## 2024-04-21 LAB — BASIC METABOLIC PANEL WITH GFR
BUN/Creatinine Ratio: 16 (ref 10–24)
BUN: 13 mg/dL (ref 8–27)
CO2: 22 mmol/L (ref 20–29)
Calcium: 9.7 mg/dL (ref 8.6–10.2)
Chloride: 101 mmol/L (ref 96–106)
Creatinine, Ser: 0.83 mg/dL (ref 0.76–1.27)
Glucose: 106 mg/dL — ABNORMAL HIGH (ref 70–99)
Potassium: 4.2 mmol/L (ref 3.5–5.2)
Sodium: 139 mmol/L (ref 134–144)
eGFR: 100 mL/min/1.73 (ref >59)

## 2024-04-25 ENCOUNTER — Encounter: Payer: Self-pay | Admitting: Internal Medicine

## 2024-04-28 ENCOUNTER — Ambulatory Visit: Admitting: Cardiology

## 2024-05-03 NOTE — Anesthesia Preprocedure Evaluation (Signed)
 Anesthesia Evaluation  Patient identified by MRN, date of birth, ID band Patient awake    Reviewed: Allergy & Precautions, NPO status , Patient's Chart, lab work & pertinent test results  History of Anesthesia Complications Negative for: history of anesthetic complications  Airway Mallampati: II  TM Distance: >3 FB Neck ROM: Full    Dental no notable dental hx. (+) Teeth Intact, Dental Advisory Given   Pulmonary    Pulmonary exam normal breath sounds clear to auscultation       Cardiovascular hypertension, Pt. on medications and Pt. on home beta blockers (-) angina + CAD and + Peripheral Vascular Disease  (-) Past MI Normal cardiovascular exam+ Valvular Problems/Murmurs MR  Rhythm:Regular Rate:Normal  03/31/2024 TTE 1. Left ventricular ejection fraction, by estimation, is 60 to 65%. Left  ventricular ejection fraction by 3D volume is 64 %. The left ventricle has  normal function. The left ventricle has no regional wall motion  abnormalities. Left ventricular diastolic   parameters were normal.   2. Right ventricular systolic function is normal. The right ventricular  size is normal.   3. Compared to previous echo, LA is larger.. Left atrial size was  moderately dilated.   4. Difficlt acoustic windows There is proabable severe MR (splay artifact  in both parasternal and apical views) Suggestion of prolapse of P2 segment  of posterior mitral leaflet with MR jet directed anteriorly into LA.   5. The aortic valve is tricuspid. Aortic valve regurgitation is not  visualized. Aortic valve sclerosis/calcification is present, without any  evidence of aortic stenosis.   6. Aneurysm of the aortic root, measuring 49 mm.     Neuro/Psych CVA (lacunar strokes in 2021 and 2023,), No Residual Symptoms    GI/Hepatic ,neg GERD  ,,  Endo/Other  neg diabetes    Renal/GU      Musculoskeletal   Abdominal   Peds  Hematology    Anesthesia Other Findings   Reproductive/Obstetrics                              Anesthesia Physical Anesthesia Plan  ASA: 3  Anesthesia Plan: MAC   Post-op Pain Management: Minimal or no pain anticipated   Induction: Intravenous  PONV Risk Score and Plan: Propofol  infusion and Treatment may vary due to age or medical condition  Airway Management Planned: Natural Airway and Nasal Cannula  Additional Equipment: None  Intra-op Plan:   Post-operative Plan:   Informed Consent: I have reviewed the patients History and Physical, chart, labs and discussed the procedure including the risks, benefits and alternatives for the proposed anesthesia with the patient or authorized representative who has indicated his/her understanding and acceptance.     Dental advisory given  Plan Discussed with: CRNA and Surgeon  Anesthesia Plan Comments:          Anesthesia Quick Evaluation

## 2024-05-04 ENCOUNTER — Ambulatory Visit (HOSPITAL_COMMUNITY)
Admission: RE | Admit: 2024-05-04 | Discharge: 2024-05-04 | Disposition: A | Discharge status: Home / Self Care | Attending: Cardiovascular Disease | Admitting: Cardiovascular Disease

## 2024-05-04 ENCOUNTER — Encounter (HOSPITAL_COMMUNITY): Payer: Self-pay | Admitting: Anesthesiology

## 2024-05-04 ENCOUNTER — Encounter (HOSPITAL_COMMUNITY)
Admission: RE | Disposition: A | Discharge status: Home / Self Care | Payer: Self-pay | Source: Home / Self Care | Attending: Cardiovascular Disease

## 2024-05-04 ENCOUNTER — Ambulatory Visit (HOSPITAL_BASED_OUTPATIENT_CLINIC_OR_DEPARTMENT_OTHER)
Admission: RE | Admit: 2024-05-04 | Discharge: 2024-05-04 | Disposition: A | Discharge status: Home / Self Care | Source: Ambulatory Visit | Attending: Cardiovascular Disease | Admitting: Cardiovascular Disease

## 2024-05-04 ENCOUNTER — Other Ambulatory Visit: Payer: Self-pay

## 2024-05-04 ENCOUNTER — Encounter (HOSPITAL_COMMUNITY): Payer: Self-pay | Admitting: Cardiovascular Disease

## 2024-05-04 ENCOUNTER — Ambulatory Visit (HOSPITAL_BASED_OUTPATIENT_CLINIC_OR_DEPARTMENT_OTHER): Payer: Self-pay | Admitting: Anesthesiology

## 2024-05-04 DIAGNOSIS — I10 Essential (primary) hypertension: Secondary | ICD-10-CM | POA: Insufficient documentation

## 2024-05-04 DIAGNOSIS — I77810 Thoracic aortic ectasia: Secondary | ICD-10-CM | POA: Diagnosis not present

## 2024-05-04 DIAGNOSIS — I341 Nonrheumatic mitral (valve) prolapse: Secondary | ICD-10-CM | POA: Insufficient documentation

## 2024-05-04 DIAGNOSIS — E785 Hyperlipidemia, unspecified: Secondary | ICD-10-CM | POA: Diagnosis not present

## 2024-05-04 DIAGNOSIS — I340 Nonrheumatic mitral (valve) insufficiency: Secondary | ICD-10-CM

## 2024-05-04 DIAGNOSIS — Z79899 Other long term (current) drug therapy: Secondary | ICD-10-CM | POA: Insufficient documentation

## 2024-05-04 DIAGNOSIS — Z7902 Long term (current) use of antithrombotics/antiplatelets: Secondary | ICD-10-CM | POA: Diagnosis not present

## 2024-05-04 DIAGNOSIS — I2510 Atherosclerotic heart disease of native coronary artery without angina pectoris: Secondary | ICD-10-CM

## 2024-05-04 DIAGNOSIS — Z8673 Personal history of transient ischemic attack (TIA), and cerebral infarction without residual deficits: Secondary | ICD-10-CM | POA: Diagnosis not present

## 2024-05-04 HISTORY — PX: TRANSESOPHAGEAL ECHOCARDIOGRAM (CATH LAB): EP1270

## 2024-05-04 LAB — ECHO TEE

## 2024-05-04 SURGERY — TRANSESOPHAGEAL ECHOCARDIOGRAM (TEE) (CATHLAB)
Anesthesia: Monitor Anesthesia Care

## 2024-05-04 MED ORDER — SODIUM CHLORIDE 0.9 % IV SOLN: INTRAVENOUS | Status: DC | PRN

## 2024-05-04 MED ORDER — PROPOFOL 10 MG/ML IV BOLUS
INTRAVENOUS | Status: DC | PRN
  Administered 2024-05-04: 30 mg via INTRAVENOUS

## 2024-05-04 MED ORDER — PROPOFOL 500 MG/50ML IV EMUL
INTRAVENOUS | Status: DC | PRN
  Administered 2024-05-04: 130 ug/kg/min via INTRAVENOUS

## 2024-05-04 MED ORDER — SODIUM CHLORIDE 0.9 % IV SOLN: INTRAVENOUS | Status: DC

## 2024-05-04 NOTE — Progress Notes (Signed)
  Echocardiogram Echocardiogram Transesophageal has been performed.  Dylan Fuller 05/04/2024, 10:35 AM

## 2024-05-04 NOTE — Discharge Instructions (Signed)

## 2024-05-04 NOTE — Transfer of Care (Signed)
 Immediate Anesthesia Transfer of Care Note  Patient: Dylan Fuller  Procedure(s) Performed: TRANSESOPHAGEAL ECHOCARDIOGRAM  Patient Location: PACU  Anesthesia Type:MAC  Level of Consciousness: awake, alert , and oriented  Airway & Oxygen Therapy: Patient Spontanous Breathing and Patient connected to nasal cannula oxygen  Post-op Assessment: Report given to RN and Post -op Vital signs reviewed and stable  Post vital signs: Reviewed and stable  Last Vitals:  Vitals Value Taken Time  BP 125/76 05/04/24 10:30  Temp 36.7 C 05/04/24 10:29  Pulse 97 05/04/24 10:31  Resp 17 05/04/24 10:31  SpO2 94 % 05/04/24 10:31  Vitals shown include unfiled device data.  Last Pain:  Vitals:   05/04/24 1029  TempSrc: Tympanic  PainSc: Asleep         Complications: No notable events documented.

## 2024-05-04 NOTE — Op Note (Signed)
 INDICATIONS: Mitral insufficiency  PROCEDURE:   Informed consent was obtained prior to the procedure. The risks, benefits and alternatives for the procedure were discussed and the patient comprehended these risks.  Risks include, but are not limited to, cough, sore throat, vomiting, nausea, somnolence, esophageal and stomach trauma or perforation, bleeding, low blood pressure, aspiration, pneumonia, infection, trauma to the teeth and death.    After a procedural time-out, the oropharynx was anesthetized with 20% benzocaine spray.   During this procedure the patient was administered IV propofol  by Anesthesiology, Dr. Julieann.  The transesophageal probe was inserted in the esophagus and stomach without difficulty and multiple views were obtained.  The patient was kept under observation until the patient left the procedure room.  The patient left the procedure room in stable condition.   Agitated microbubble saline contrast was not administered.  COMPLICATIONS:    There were no immediate complications.  FINDINGS:  The left ventricle is not dilated and has normal systolic function , EF 60%. Mitral valve prolapse, most pronounced at the medial (P3) scallop of the posterior mitral leaflet. There is mild-moderate eccentric mitral insufficiency. There is forward flow in the right and left pulmonary veins. There is moderate dilation of the aortic root, 48 mm maximum diameter. The aortic valve is trileaflet and there is no aortic insufficiency.   RECOMMENDATIONS:     Monitor with TTE for MR and CT angio for aortic root dilation.  Time Spent Directly with the Patient:  45  minutes   Dylan Fuller 05/04/2024, 10:22 AM

## 2024-05-04 NOTE — Anesthesia Postprocedure Evaluation (Signed)
 Anesthesia Post Note  Patient: Dylan Fuller  Procedure(s) Performed: TRANSESOPHAGEAL ECHOCARDIOGRAM     Patient location during evaluation: Cath Lab Anesthesia Type: MAC Level of consciousness: awake and alert Pain management: pain level controlled Vital Signs Assessment: post-procedure vital signs reviewed and stable Respiratory status: spontaneous breathing, nonlabored ventilation, respiratory function stable and patient connected to nasal cannula oxygen Cardiovascular status: stable and blood pressure returned to baseline Postop Assessment: no apparent nausea or vomiting Anesthetic complications: no   There were no known notable events for this encounter.  Last Vitals:  Vitals:   05/04/24 1039 05/04/24 1049  BP: 124/86 122/84  Pulse: 96 94  Resp: 20 16  Temp:    SpO2: 96% 95%    Last Pain:  Vitals:   05/04/24 1049  TempSrc:   PainSc: 0-No pain                 Garnette DELENA Gab

## 2024-05-04 NOTE — Interval H&P Note (Signed)
 History and Physical Interval Note:  05/04/2024 9:51 AM  Dylan Fuller High  has presented today for surgery, with the diagnosis of MITRAL VALVE REGURGITATION.  The various methods of treatment have been discussed with the patient and family. After consideration of risks, benefits and other options for treatment, the patient has consented to  Procedure(s): TRANSESOPHAGEAL ECHOCARDIOGRAM (N/A) as a surgical intervention.  The patient's history has been reviewed, patient examined, no change in status, stable for surgery.  I have reviewed the patient's chart and labs.  Questions were answered to the patient's satisfaction.     [name]

## 2024-05-10 ENCOUNTER — Telehealth: Payer: Self-pay | Admitting: *Deleted

## 2024-05-10 DIAGNOSIS — E559 Vitamin D deficiency, unspecified: Secondary | ICD-10-CM

## 2024-05-10 NOTE — Telephone Encounter (Signed)
 Copied from CRM #[id number]. Topic: Clinical - Request for Lab/Test Order >> May 10, 2024  8:39 AM Berneda FALCON wrote: Reason for CRM: Patient requesting orders for routine Vitamin D  check for labs. He would like an appt on Monday 12/1 once the orders are placed please.

## 2024-05-15 ENCOUNTER — Other Ambulatory Visit (INDEPENDENT_AMBULATORY_CARE_PROVIDER_SITE_OTHER)

## 2024-05-15 DIAGNOSIS — E559 Vitamin D deficiency, unspecified: Secondary | ICD-10-CM | POA: Diagnosis not present

## 2024-05-15 NOTE — Telephone Encounter (Signed)
Lab appointment and order placed

## 2024-05-16 ENCOUNTER — Ambulatory Visit: Payer: Self-pay | Admitting: Internal Medicine

## 2024-05-16 LAB — VITAMIN D 25 HYDROXY (VIT D DEFICIENCY, FRACTURES): VITD: 30.21 ng/mL (ref 30.00–100.00)

## 2024-05-19 ENCOUNTER — Encounter: Payer: Self-pay | Admitting: Physician Assistant

## 2024-05-19 ENCOUNTER — Ambulatory Visit: Admitting: Physician Assistant

## 2024-05-19 VITALS — BP 122/80 | HR 83 | Ht 72.0 in | Wt 231.0 lb

## 2024-05-19 DIAGNOSIS — Z7901 Long term (current) use of anticoagulants: Secondary | ICD-10-CM

## 2024-05-19 DIAGNOSIS — Z860100 Personal history of colon polyps, unspecified: Secondary | ICD-10-CM

## 2024-05-19 NOTE — Progress Notes (Signed)
 Ellouise Console, PA-C [address] [place], [place]  [address] Phone: [phone]   Gastroenterology Consultation  Referring Provider:     Theophilus Andrews, Jonna* Primary Care Physician:  Theophilus Andrews, Tully GRADE, MD Primary Gastroenterologist:  Ellouise Console, PA-C / Elspeth Naval, MD  Reason for Consultation:     History of Colon Polyps        HPI:   Discussed the use of AI scribe software for clinical note transcription with the patient, who gave verbal consent to proceed. History of Present Illness Dylan Fuller is a 58 year old male who presents for scheduling a repeat colonoscopy.  Colon Polyp surveillance - Colonoscopy in 2018 with removal of adenomatous polyps - Initially advised to repeat colonoscopy in 5 years, but updated guidelines extended interval to 7 years - Next colonoscopy due in 2025 - No abdominal pain, diarrhea, constipation, or hematochezia  Gastroesophageal symptoms - Occasional heartburn, attributed to eating late - No chronic or unexplained heartburn - Resolves with dietary modification.  Cerebrovascular disease - History of small lacunar strokes several years ago - Currently taking Plavix  for secondary stroke prevention prescribed by his PCP - No aspirin  use  Cardiac history - History of mitral valve regurgitation - Transesophageal echocardiogram (TEE) last month showed stable regurgitation - Ejection fraction described as normal LVEF 60-65%.  PMH: CVA (2021, 2023), severe mitral valve regurgitation,, severe mitral valve prolapse, HTN, obesity, hyperlipidemia.  Currently on Plavix .  He last saw Dr. Jeffrie 04/20/2024.  TEE 05/04/2024 moderate mitral valve regurg.  LVEF 60 to 65%.  11/2016 last colonoscopy by Dr. Naval: 5 small (4 mm to 5 mm) polyps removed throughout the colon.  Pathology showed 2 small tubular adenomas, 1 hyperplastic, and 2 colon mucosal polyps.  Diverticulosis.  Otherwise normal.  Good prep.  7-year repeat  colonoscopy was due 11/2023.  Past Medical History:  Diagnosis Date   Constipation    CVA (cerebral vascular accident) (HCC)    09/2019   Hyperlipidemia    Hypertension    Obesity (BMI 30.0-34.9)    Prostatitis, chronic     Past Surgical History:  Procedure Laterality Date   HERNIA REPAIR     age 67 - ?ingunial x2   TRANSESOPHAGEAL ECHOCARDIOGRAM (CATH LAB) N/A 05/04/2024   Procedure: TRANSESOPHAGEAL ECHOCARDIOGRAM;  Surgeon: Francyne Headland, MD;  Location: [facility] INVASIVE CV LAB;  Service: Cardiovascular;  Laterality: N/A;    Prior to Admission medications   Medication Sig Start Date End Date Taking? Authorizing Provider  Acetaminophen  (TYLENOL  PO) Take by mouth as needed.    [provider]  amLODipine  (NORVASC ) 5 MG tablet TAKE 1 TABLET BY MOUTH DAILY 04/24/24   Theophilus Andrews, Tully GRADE, MD  atorvastatin  (LIPITOR) 40 MG tablet Take 1 tablet (40 mg total) by mouth daily. 08/26/23   Theophilus Andrews, Tully GRADE, MD  clopidogrel  (PLAVIX ) 75 MG tablet Take 1 tablet (75 mg total) by mouth daily. 08/26/23   Theophilus Andrews, Tully GRADE, MD  cyanocobalamin  (VITAMIN B12) 1000 MCG tablet Take 1,000 mcg by mouth daily.    [provider]  metoprolol  tartrate (LOPRESSOR ) 100 MG tablet Take 1 tablet (100 mg total) by mouth as directed. Take one tablet (2)  hrs before your CT scan Patient not taking: Reported on 04/28/2024 04/20/24   Jeffrie Oneil BROCKS, MD  Vitamin D , Ergocalciferol , (DRISDOL ) 1.25 MG (50000 UNIT) CAPS capsule Take 1 capsule (50,000 Units total) by mouth every 7 (seven) days. 02/28/24   Theophilus Andrews,  Tully GRADE, MD    Family History  Problem Relation Age of Onset   Arthritis Mother    Heart disease Mother    Hyperlipidemia Mother    Hypertension Mother    Dementia Mother    Heart disease Father        69; post-rheumatic fever valve disease   Colon cancer Neg Hx    Colon polyps Neg Hx    Esophageal cancer Neg Hx    Rectal cancer Neg Hx    Stomach cancer Neg Hx     Stroke Neg Hx    Sleep apnea Neg Hx      Social History   Tobacco Use   Smoking status: Never   Smokeless tobacco: Never   Tobacco comments:    pt states he has smoked a few cigarettes in his lifetime only   Substance Use Topics   Alcohol use: Yes    Alcohol/week: 4.0 standard drinks of alcohol    Types: 4 Cans of beer per week    Comment: OCC   Drug use: Never    Allergies as of 05/19/2024   (No Known Allergies)    Review of Systems:    All systems reviewed and negative except where noted in HPI.   Physical Exam:  BP 122/80   Pulse 83   Ht 6' (1.829 m)   Wt 231 lb (104.8 kg)   BMI 31.33 kg/m  No LMP for male patient.  General:   Alert,  Well-developed, well-nourished, pleasant and cooperative in NAD Lungs:  Respirations even and unlabored.  Clear throughout to auscultation.   No wheezes, crackles, or rhonchi. No acute distress. Heart:  Regular rate and rhythm; no murmurs, clicks, rubs, or gallops. Abdomen:  Normal bowel sounds.  No bruits.  Soft, and non-distended without masses, hepatosplenomegaly or hernias noted.  No Tenderness.  No guarding or rebound tenderness.    Neurologic:  Alert and oriented x3;  grossly normal neurologically. Psych:  Alert and cooperative. Normal mood and affect.   Imaging Studies: ECHO TEE Result Date: 05/04/2024    TRANSESOPHOGEAL ECHO REPORT   Patient Name:   Dylan Fuller Date of Exam: 05/04/2024 Medical Rec #:  [id number]       Height:       72.0 in Accession #:    [id number]      Weight:       230.0 lb Date of Birth:  [birth year]       BSA:          2.261 m Patient Age:    58 years        BP:           125/76 mmHg Patient Gender: M               HR:           94 bpm. Exam Location:  Inpatient Procedure: 3D Echo, Transesophageal Echo, Cardiac Doppler and Color Doppler            (Both Spectral and Color Flow Doppler were utilized during            procedure). Indications:     I34.0 Nonrheumatic mitral (valve) insufficiency; I34.1                   Nonrheumatic mitral (valve) prolapse  History:         Patient has prior history of Echocardiogram examinations, most  recent 03/31/2024. Stroke; Risk Factors:Hypertension and                  Dyslipidemia.  Sonographer:     Ellouise Mose RDCS Referring Phys:  [phone] MIHAI CROITORU Diagnosing Phys: Jerel Balding MD PROCEDURE: After discussion of the risks and benefits of a TEE, an informed consent was obtained from the patient. The transesophogeal probe was passed without difficulty through the esophogus of the patient. Imaged were obtained with the patient in a supine position. Sedation performed by different physician. The patient was monitored while under deep sedation. Anesthestetic sedation was provided intravenously by Anesthesiology: 253mg  of Propofol . The patient's vital signs; including heart rate, blood pressure, and oxygen saturation; remained stable throughout the procedure. The patient developed Coughing throughout exam during the procedure.  IMPRESSIONS  1. Left ventricular ejection fraction, by estimation, is 60 to 65%. The left ventricle has normal function. The left ventricle has no regional wall motion abnormalities.  2. Right ventricular systolic function is normal. The right ventricular size is normal.  3. No left atrial/left atrial appendage thrombus was detected.  4. The mitral valve is myxomatous. Mild to moderate mitral valve regurgitation. No evidence of mitral stenosis. There is mild holosystolic prolapse of the medial scallop of the posterior leaflet of the mitral valve.  5. The aortic valve is tricuspid. Aortic valve regurgitation is trivial. No aortic stenosis is present.  6. Aortic dilatation noted. There is moderate dilatation of the aortic root, measuring 48 mm. There is borderline dilatation of the ascending aorta, measuring 38 mm.  7. The inferior vena cava is normal in size with greater than 50% respiratory variability, suggesting right atrial pressure of 3  mmHg.  8. 3D performed of the mitral valve and demonstrates prolapse of the P3 (medial) posterior leaflet scallop. FINDINGS  Left Ventricle: Left ventricular ejection fraction, by estimation, is 60 to 65%. The left ventricle has normal function. The left ventricle has no regional wall motion abnormalities. The left ventricular internal cavity size was normal in size. There is  no left ventricular hypertrophy. Right Ventricle: The right ventricular size is normal. No increase in right ventricular wall thickness. Right ventricular systolic function is normal. Left Atrium: Left atrial size was normal in size. No left atrial/left atrial appendage thrombus was detected. Right Atrium: Right atrial size was normal in size. Pericardium: There is no evidence of pericardial effusion. Mitral Valve: Mild P3 prolapse. The mitral valve is myxomatous. There is mild holosystolic prolapse of the medial scallop of the posterior leaflet of the mitral valve. Mild to moderate mitral valve regurgitation, with eccentric anteriorly directed jet. No evidence of mitral valve stenosis. Tricuspid Valve: The tricuspid valve is normal in structure. Tricuspid valve regurgitation is trivial. No evidence of tricuspid stenosis. Aortic Valve: The aortic valve is tricuspid. Aortic valve regurgitation is trivial. No aortic stenosis is present. Pulmonic Valve: The pulmonic valve was normal in structure. Pulmonic valve regurgitation is trivial. No evidence of pulmonic stenosis. Aorta: Aortic dilatation noted. There is moderate dilatation of the aortic root, measuring 48 mm. There is borderline dilatation of the ascending aorta, measuring 38 mm. There is minimal (Grade I) plaque. Venous: A normal flow pattern is recorded from the left upper pulmonary vein and the right upper pulmonary vein. The inferior vena cava is normal in size with greater than 50% respiratory variability, suggesting right atrial pressure of 3 mmHg. IAS/Shunts: The interatrial septum  was not assessed. Additional Comments: Spectral Doppler performed.  AORTA Ao Root diam: 4.80  cm Ao Asc diam:  3.80 cm Jerel Balding MD Electronically signed by Jerel Balding MD Signature Date/Time: 05/04/2024/2:18:51 PM    Final    EP STUDY Result Date: 05/04/2024 See surgical note for result.   Labs: CBC    Component Value Date/Time   WBC 10.1 04/20/2024 0926   WBC 7.4 08/26/2023 1046   RBC 5.37 04/20/2024 0926   RBC 5.17 08/26/2023 1046   HGB 16.5 04/20/2024 0926   HCT 48.9 04/20/2024 0926   PLT 254 04/20/2024 0926   MCV 91 04/20/2024 0926    CMP     Component Value Date/Time   NA 139 04/20/2024 0926   K 4.2 04/20/2024 0926   CL 101 04/20/2024 0926   CO2 22 04/20/2024 0926   GLUCOSE 106 (H) 04/20/2024 0926   GLUCOSE 106 (H) 08/26/2023 1046   BUN 13 04/20/2024 0926   CREATININE 0.83 04/20/2024 0926   CALCIUM  9.7 04/20/2024 0926   PROT 7.2 08/26/2023 1046   ALBUMIN 5.0 08/26/2023 1046   AST 26 08/26/2023 1046   ALT 51 08/26/2023 1046   ALKPHOS 73 08/26/2023 1046   BILITOT 0.9 08/26/2023 1046   GFRNONAA >60 12/01/2021 0346   GFRAA >60 09/27/2019 2301    Assessment and Plan:   Dylan Fuller is a 58 y.o. y/o male has been referred for: Assessment & Plan History of adenomatous colonic polyps - Scheduling Colonoscopy I discussed risks of colonoscopy with patient to include risk of bleeding, colon perforation, and risk of sedation.  Patient expressed understanding and agrees to proceed with colonoscopy.  - Use Suprep for bowel preparation, starting the day before the procedure with a liquid diet. - Provided written instructions for bowel preparation and procedure scheduling.  2.   Hx Mild CVA; Currently on Plavix  - Sent message to his PCP to obtain permission to hold Plavix  5 days prior to colonoscopy. - Resume Plavix  the day after the colonoscopy.   Follow up As Needed.  Ellouise Console, PA-C

## 2024-05-19 NOTE — Patient Instructions (Addendum)
 You have been scheduled for a Colonoscopy. Please follow written instructions given to you at your visit today.   If you use inhalers (even only as needed), please bring them with you on the day of your procedure.  DO NOT TAKE 7 DAYS PRIOR TO TEST- Trulicity (dulaglutide) Ozempic, Wegovy (semaglutide) Mounjaro (tirzepatide) Bydureon Bcise (exanatide extended release)  DO NOT TAKE 1 DAY PRIOR TO YOUR TEST Rybelsus (semaglutide) Adlyxin (lixisenatide) Victoza (liraglutide) Byetta (exanatide) ___________________________________________________________________________  Please follow up sooner if symptoms increase or worsen   Due to recent changes in healthcare laws, you may see the results of your imaging and laboratory studies on MyChart before your provider has had a chance to review them.  We understand that in some cases there may be results that are confusing or concerning to you. Not all laboratory results come back in the same time frame and the provider may be waiting for multiple results in order to interpret others.  Please give us  48 hours in order for your provider to thoroughly review all the results before contacting the office for clarification of your results.   Thank you for trusting me with your gastrointestinal care!   Ellouise Console, PA-C _______________________________________________________  If your blood pressure at your visit was 140/90 or greater, please contact your primary care physician to follow up on this.  _______________________________________________________  If you are age 59 or older, your body mass index should be between 23-30. Your Body mass index is 31.33 kg/m. If this is out of the aforementioned range listed, please consider follow up with your Primary Care Provider.  If you are age 74 or younger, your body mass index should be between 19-25. Your Body mass index is 31.33 kg/m. If this is out of the aformentioned range listed, please consider  follow up with your Primary Care Provider.   ________________________________________________________  The [facility] providers would like to encourage you to use MYCHART to communicate with providers for non-urgent requests or questions.  Due to long hold times on the telephone, sending your provider a message by [facility] may be a faster and more efficient way to get a response.  Please allow 48 business hours for a response.  Please remember that this is for non-urgent requests.  _______________________________________________________

## 2024-05-21 NOTE — Progress Notes (Signed)
 Agree with assessment and plan as outlined.

## 2024-05-24 ENCOUNTER — Encounter (HOSPITAL_COMMUNITY): Payer: Self-pay

## 2024-05-26 ENCOUNTER — Ambulatory Visit (HOSPITAL_COMMUNITY): Admission: RE | Admit: 2024-05-26 | Discharge: 2024-05-26 | Attending: Cardiology | Admitting: Cardiology

## 2024-05-26 DIAGNOSIS — I340 Nonrheumatic mitral (valve) insufficiency: Secondary | ICD-10-CM

## 2024-05-26 MED ORDER — DILTIAZEM HCL 25 MG/5ML IV SOLN: 10.0000 mg | INTRAVENOUS | Status: DC | PRN

## 2024-05-26 MED ORDER — METOPROLOL TARTRATE 5 MG/5ML IV SOLN
10.0000 mg | Freq: Once | INTRAVENOUS | Status: AC | PRN
  Administered 2024-05-26: 10 mg via INTRAVENOUS

## 2024-05-26 MED ORDER — IOHEXOL 350 MG/ML SOLN
100.0000 mL | Freq: Once | INTRAVENOUS | Status: AC | PRN
  Administered 2024-05-26: 100 mL via INTRAVENOUS

## 2024-05-26 MED ORDER — NITROGLYCERIN 0.4 MG SL SUBL: 0.8000 mg | SUBLINGUAL_TABLET | Freq: Once | SUBLINGUAL | Status: DC

## 2024-05-29 ENCOUNTER — Other Ambulatory Visit: Payer: Self-pay | Admitting: Internal Medicine

## 2024-05-29 ENCOUNTER — Ambulatory Visit (HOSPITAL_COMMUNITY)
Admission: RE | Admit: 2024-05-29 | Discharge: 2024-05-29 | Disposition: A | Source: Ambulatory Visit | Attending: Internal Medicine

## 2024-05-29 DIAGNOSIS — R931 Abnormal findings on diagnostic imaging of heart and coronary circulation: Secondary | ICD-10-CM | POA: Insufficient documentation

## 2024-06-02 ENCOUNTER — Ambulatory Visit: Payer: Self-pay | Admitting: Cardiology

## 2024-06-02 NOTE — Addendum Note (Signed)
 Addended by: CASIMIR ALDONA BRAVO on: 06/02/2024 10:13 AM   Modules accepted: Orders

## 2024-06-29 ENCOUNTER — Encounter: Payer: Self-pay | Admitting: Gastroenterology

## 2024-07-19 ENCOUNTER — Other Ambulatory Visit: Payer: Self-pay

## 2024-07-19 ENCOUNTER — Telehealth: Payer: Self-pay | Admitting: Physician Assistant

## 2024-07-19 MED ORDER — NA SULFATE-K SULFATE-MG SULF 17.5-3.13-1.6 GM/177ML PO SOLN: ORAL | 0 refills | Status: AC

## 2024-07-19 NOTE — Telephone Encounter (Signed)
 Dr. Theophilus:    Request for surgical clearance:     Endoscopy Procedure  What type of surgery is being performed?     Colonoscopy   When is this surgery scheduled?     09/08/24  What type of clearance is required ?   Pharmacy  Are there any medications that need to be held prior to surgery and how long? Plavix  for 5 days  Practice name and name of physician performing surgery?      [facility]  What is your office phone and fax number?      Phone- [phone]  Fax- [phone]  Anesthesia type (None, local, MAC, general) ?       MAC   Please route your response to Rock Mania RN

## 2024-07-19 NOTE — Telephone Encounter (Signed)
 Please see note below and advise regarding Plavix .

## 2024-07-19 NOTE — Telephone Encounter (Signed)
 The following patient scheduled his colonoscopy. He was given blank instructions until he received a date and will need new ones sent to Mychart. PT would also like to speak with a nurse regarding his prep as well. Please advise.

## 2024-07-19 NOTE — Telephone Encounter (Signed)
 Suprep prescription has been sent to the pharmacy, clearance obtained to hold plavix  for 5 days. Prep instructions sent to pt via mychart.

## 2024-09-08 ENCOUNTER — Encounter: Admitting: Gastroenterology

## 2024-09-14 ENCOUNTER — Encounter: Admitting: Internal Medicine

## 2024-09-21 ENCOUNTER — Encounter: Admitting: Internal Medicine

## 2024-11-10 ENCOUNTER — Ambulatory Visit (HOSPITAL_COMMUNITY)
